# Patient Record
Sex: Female | Born: 1975 | Race: White | Hispanic: No | Marital: Married | Smoking: Former smoker
Health system: Southern US, Community
[De-identification: ages and names within clinical notes are randomized; demographics above are authoritative.]

## PROBLEM LIST (undated history)

## (undated) DIAGNOSIS — G43909 Migraine, unspecified, not intractable, without status migrainosus: Secondary | ICD-10-CM

## (undated) DIAGNOSIS — T7840XA Allergy, unspecified, initial encounter: Secondary | ICD-10-CM

## (undated) DIAGNOSIS — R002 Palpitations: Secondary | ICD-10-CM

## (undated) HISTORY — PX: WISDOM TOOTH EXTRACTION: SHX21

## (undated) HISTORY — DX: Allergy, unspecified, initial encounter: T78.40XA

## (undated) HISTORY — DX: Palpitations: R00.2

## (undated) HISTORY — DX: Migraine, unspecified, not intractable, without status migrainosus: G43.909

---

## 1998-10-18 ENCOUNTER — Other Ambulatory Visit: Admission: RE | Admit: 1998-10-18 | Discharge: 1998-10-18 | Payer: Self-pay | Admitting: Obstetrics and Gynecology

## 1999-09-09 ENCOUNTER — Other Ambulatory Visit: Admission: RE | Admit: 1999-09-09 | Discharge: 1999-09-09 | Payer: Self-pay | Admitting: Obstetrics and Gynecology

## 2000-03-30 ENCOUNTER — Inpatient Hospital Stay (HOSPITAL_COMMUNITY): Admission: AD | Admit: 2000-03-30 | Discharge: 2000-04-02 | Payer: Self-pay | Admitting: Obstetrics and Gynecology

## 2000-05-04 ENCOUNTER — Other Ambulatory Visit: Admission: RE | Admit: 2000-05-04 | Discharge: 2000-05-04 | Payer: Self-pay | Admitting: Obstetrics and Gynecology

## 2000-11-27 ENCOUNTER — Emergency Department (HOSPITAL_COMMUNITY): Admission: EM | Admit: 2000-11-27 | Discharge: 2000-11-28 | Payer: Self-pay

## 2000-12-07 ENCOUNTER — Observation Stay (HOSPITAL_COMMUNITY): Admission: RE | Admit: 2000-12-07 | Discharge: 2000-12-08 | Payer: Self-pay | Admitting: Specialist

## 2001-03-11 ENCOUNTER — Encounter: Payer: Self-pay | Admitting: Specialist

## 2001-03-11 ENCOUNTER — Ambulatory Visit (HOSPITAL_COMMUNITY): Admission: RE | Admit: 2001-03-11 | Discharge: 2001-03-11 | Payer: Self-pay | Admitting: Specialist

## 2001-05-27 ENCOUNTER — Other Ambulatory Visit: Admission: RE | Admit: 2001-05-27 | Discharge: 2001-05-27 | Payer: Self-pay | Admitting: Obstetrics and Gynecology

## 2001-08-27 ENCOUNTER — Encounter: Admission: RE | Admit: 2001-08-27 | Discharge: 2001-09-20 | Payer: Self-pay | Admitting: Orthopaedic Surgery

## 2002-03-03 ENCOUNTER — Other Ambulatory Visit: Admission: RE | Admit: 2002-03-03 | Discharge: 2002-03-03 | Payer: Self-pay | Admitting: Obstetrics and Gynecology

## 2002-09-24 ENCOUNTER — Encounter (INDEPENDENT_AMBULATORY_CARE_PROVIDER_SITE_OTHER): Payer: Self-pay | Admitting: Specialist

## 2002-09-24 ENCOUNTER — Inpatient Hospital Stay (HOSPITAL_COMMUNITY): Admission: AD | Admit: 2002-09-24 | Discharge: 2002-09-26 | Payer: Self-pay | Admitting: Obstetrics and Gynecology

## 2002-10-24 ENCOUNTER — Other Ambulatory Visit: Admission: RE | Admit: 2002-10-24 | Discharge: 2002-10-24 | Payer: Self-pay | Admitting: Obstetrics and Gynecology

## 2003-10-25 ENCOUNTER — Other Ambulatory Visit: Admission: RE | Admit: 2003-10-25 | Discharge: 2003-10-25 | Payer: Self-pay | Admitting: Obstetrics and Gynecology

## 2004-10-16 ENCOUNTER — Ambulatory Visit: Payer: Self-pay | Admitting: Family Medicine

## 2004-11-02 ENCOUNTER — Emergency Department (HOSPITAL_COMMUNITY): Admission: EM | Admit: 2004-11-02 | Discharge: 2004-11-02 | Payer: Self-pay | Admitting: Emergency Medicine

## 2005-01-23 ENCOUNTER — Inpatient Hospital Stay (HOSPITAL_COMMUNITY): Admission: AD | Admit: 2005-01-23 | Discharge: 2005-01-23 | Payer: Self-pay | Admitting: *Deleted

## 2005-05-30 ENCOUNTER — Ambulatory Visit (HOSPITAL_COMMUNITY): Admission: RE | Admit: 2005-05-30 | Discharge: 2005-05-30 | Payer: Self-pay | Admitting: Obstetrics and Gynecology

## 2005-11-12 ENCOUNTER — Inpatient Hospital Stay (HOSPITAL_COMMUNITY): Admission: AD | Admit: 2005-11-12 | Discharge: 2005-11-15 | Payer: Self-pay | Admitting: Obstetrics and Gynecology

## 2005-11-24 ENCOUNTER — Inpatient Hospital Stay (HOSPITAL_COMMUNITY): Admission: AD | Admit: 2005-11-24 | Discharge: 2005-11-24 | Payer: Self-pay | Admitting: Obstetrics and Gynecology

## 2007-07-06 ENCOUNTER — Ambulatory Visit: Payer: Self-pay | Admitting: Family Medicine

## 2007-07-06 DIAGNOSIS — K5289 Other specified noninfective gastroenteritis and colitis: Secondary | ICD-10-CM | POA: Insufficient documentation

## 2007-07-06 DIAGNOSIS — G43909 Migraine, unspecified, not intractable, without status migrainosus: Secondary | ICD-10-CM | POA: Insufficient documentation

## 2007-08-16 ENCOUNTER — Telehealth (INDEPENDENT_AMBULATORY_CARE_PROVIDER_SITE_OTHER): Payer: Self-pay | Admitting: Internal Medicine

## 2008-01-03 ENCOUNTER — Ambulatory Visit: Payer: Self-pay | Admitting: Family Medicine

## 2008-01-03 DIAGNOSIS — L259 Unspecified contact dermatitis, unspecified cause: Secondary | ICD-10-CM | POA: Insufficient documentation

## 2008-01-04 ENCOUNTER — Ambulatory Visit: Payer: Self-pay | Admitting: Family Medicine

## 2008-01-04 DIAGNOSIS — T6391XA Toxic effect of contact with unspecified venomous animal, accidental (unintentional), initial encounter: Secondary | ICD-10-CM | POA: Insufficient documentation

## 2008-09-07 ENCOUNTER — Telehealth: Payer: Self-pay | Admitting: Family Medicine

## 2010-01-09 ENCOUNTER — Encounter (INDEPENDENT_AMBULATORY_CARE_PROVIDER_SITE_OTHER): Payer: Self-pay | Admitting: *Deleted

## 2010-06-20 ENCOUNTER — Telehealth: Payer: Self-pay | Admitting: Family Medicine

## 2010-07-09 NOTE — Letter (Signed)
Summary: Jamie [name] letter  Agency at [facility]  [address] [place], [place] [address]   Phone: [phone]  Fax: [phone]       01/09/2010 MRN: [id number]  Jamie [name] [facility] [address] [place], [place]  [address]  Dear Ms. [facility] [place] [place] announce the retirement of Arta Silence, M.D., from full-time practice at the [facility] office effective December 06, 2009 and his plans of returning part-time.  It is important to Dr. Hetty Ely and to our practice that you understand that [facility] has seven physicians in our office for your health care needs.  We will continue to offer the same exceptional care that you have today.    Dr. Hetty Ely has spoken to many of you about his plans for retirement and returning part-time in the fall.   We will continue to work with you through the transition to schedule appointments for you in the office and meet the high standards that [place] is committed to.   Again, it is with great pleasure that we share the news that Dr. Hetty Ely will return to [facility] in October of 2011 with a reduced schedule.    If you have any questions, or would like to request an appointment with one of our physicians, please call us at [phone] and press the option for Scheduling an appointment.  We take pleasure in providing you with excellent patient care and look forward to seeing you at your next office visit.  Our [facility] Physicians are:  Tillman Abide, M.D. Laurita Quint, M.D. Roxy Manns, M.D. Kerby Nora, M.D. Hannah Beat, M.D. Ruthe Mannan, M.D. We proudly welcomed Raechel Ache, M.D. and Eustaquio Boyden, M.D. to the practice in July/August 2011.  Sincerely,  [facility]

## 2010-07-11 NOTE — Progress Notes (Signed)
Summary: Rx Maxalt  Phone Note Refill Request Call back at [phone] Message from:  [facility] on June 20, 2010 8:41 AM  Refills Requested: Medication #1:  MAXALT-MLT 10 MG  TBDP 1 at onset and repeat in 2 h as needed migraine   Last Refilled: 06/11/2009 HAS NOT BEEN SEEN SINCE JULY 2009  Initial call taken by: Sydell Axon LPN,  June 20, 2010 8:41 AM  Follow-up for Phone Call        Please have pt come in to be seen. Follow-up by: Shaune Leeks MD,  June 20, 2010 10:37 AM  Additional Follow-up for Phone Call Additional follow up Details #1::        Called and spoke to patient to schedule an appointment. Patient stated that she will have to check her schedule and will call back to schedule an appointment. Additional Follow-up by: Sydell Axon LPN,  June 20, 2010 10:42 AM

## 2010-10-25 NOTE — Discharge Summary (Signed)
[facility]  Patient:    Jamie [name], Jamie [name]                         MRN: [id number] Adm. Date:  03/30/00 Disc. Date: 04/02/00 Attending:  Lenoard Aden                           Discharge Summary  HOSPITAL COURSE:              Patient underwent induction for presumed LGA, favorable cervix, at 39+ weeks.  Labor complicated by hypotonicity, had a deep transverse arrest and was fully dilated.  Underwent primary C-section for deep transverse arrest and failure to descend.  Postoperative course uncomplicated. Discharged to home on day #3.  DISCHARGE MEDICATIONS:        Tylox, prenatal vitamins and iron given.  DISCHARGE INSTRUCTIONS:       Discharge teaching done.  FOLLOWUP:                     Follow up in four to six weeks. DD:  04/22/00 TD:  04/23/00 Job: [id number]

## 2010-10-25 NOTE — Op Note (Signed)
NAMESAFIYYAH, [name]                ACCOUNT NO.:  [id number]   MEDICAL RECORD NO.:  [id number]          PATIENT TYPE:  INP   LOCATION:  9132                          [facility]ACILITY:  [facility]   PHYSICIAN:  Lenoard Aden, M.D.DATE OF BIRTH:  [name], 1977   DATE OF PROCEDURE:  11/12/2005  DATE OF DISCHARGE:                                 OPERATIVE REPORT   PREOPERATIVE DIAGNOSIS:  Intrauterine pregnancy at 37 weeks, previous lower  uterine segment partial dehiscence, for repeat cesarean section.   POSTOPERATIVE DIAGNOSIS:  Intrauterine pregnancy at 37 weeks, previous lower  uterine segment partial dehiscence, for repeat cesarean section.   PROCEDURE:  Repeat low segment transverse cesarean section.   SURGEON:  Lenoard Aden, M.D.   ASSISTANT:  Marlinda Mike, C.N.M.   ANESTHESIA:  Spinal by Jean Rosenthal.   ESTIMATED BLOOD LOSS:  1000 cc.   COMPLICATIONS:  None.   DRAINS:  Foley.   DISPOSITION:  Patient to recovery in good condition.   FINDINGS:  Full-term living female, occiput transverse position, Apgars 8  and 9, posterior placenta.  Anterior lower uterine segment well developed,  but no recurrent window noted.  Normal tubes, normal ovaries, no evidence of  lower uterine segment dehiscence.   BRIEF OPERATIVE NOTE:  After being apprised of the risks of anesthesia,  infection, bleeding, or need for repair, immediate complications, bowel and  bladder injury, the patient was brought to the operating room where she was  administered spinal anesthetic without complications, prepped and draped in  usual sterile fashion, and Foley catheter placed.  After achieving adequate  anesthesia, dilute Marcaine solution was placed in the area of previous  Pfannenstiel skin incision.  Incision was then made with the scalpel,  carried down to the fascia which was nicked in the midline and extended  transversely using Mayo scissors.  Rectus muscles were dissected sharply in  the midline.  Peritoneum  entered sharply and bladder blade placed.  Visceral  peritoneum scored in a smile-like fashion and dissected sharply off the  lower uterine segment.  Hysterotomy incision made, atraumatic delivery of a  full-term living female, as noted.  Apgars 8 and 9.  Cord blood collected.  Placenta delivered manually, intact, three vessel cord.  Uterus exteriorized  and closed in 2 layers using 0 Monocryl continuous running imbricating  suture.  Good  hemostasis noted.  Bladder flap inspected, found to be hemostatic.  Irrigation accomplished.  Fascia closed using 0 Monocryl in interrupted  fashion.  Skin was closed using staples.  The patient tolerated the  procedure well and was transferred to recovery in good condition.      Lenoard Aden, M.D.  Electronically Signed     RJT/MEDQ  D:  11/12/2005  T:  11/12/2005  Job:  [id number]

## 2010-10-25 NOTE — Op Note (Signed)
[facility]  Patient:    TAHLOR, BERENGUER Visit Number: [id number] MRN: [id number]          Service Type: DSU Location: DAY Attending Physician:  Rocky Crafts Dictated by:   Michael Litter. Montez Morita, M.D. Proc. Date: 03/11/01 Admit Date:  03/11/2001                             Operative Report  PREOPERATIVE DIAGNOSIS:  Healed distal clavicle fracture with retained coracoclavicular wire.  POSTOPERATIVE DIAGNOSIS:  Healed distal clavicle fracture with retained coracoclavicular wire.  OPERATIVE PROCEDURE:  Wire removal.  SURGEON:  Philips J. Montez Morita, M.D.  DESCRIPTION OF PROCEDURE:  After suitable general anesthesia without intubation, the area over the previous incisional scar was prepped and draped routinely.  An incision was made about 1 inch long over the palpable wire, and the wire was freed up, and clipped on one end, and pulled out.  The wound was closed with some coated Vicryl and subcuticular Monocryl with Steri-Strips dressing.  She goes to the recovery room in good condition.  Half percent Marcaine was placed about the clavicle and in the skin at the end of the procedure. Dictated by:   [employer] Montez Morita, M.D. Attending Physician:  Rocky Crafts DD:  03/11/01 TD:  03/11/01 Job: [id number]

## 2010-10-25 NOTE — Discharge Summary (Signed)
[name], Jamie [name] NO.:  [id number]   MEDICAL RECORD NO.:  [id number]          PATIENT TYPE:  INP   LOCATION:                                [facility]ILITY[facility]  [facility]   PHYSICIAN:  Lenoard Aden, M.D.DATE OF BIRTH:  Apr 14, 1976   DATE OF ADMISSION:  11/12/2005  DATE OF DISCHARGE:  11/15/2005                                 DISCHARGE SUMMARY   HOSPITAL COURSE:  The patient underwent an uncomplicated repeat C-section,  November 12, 2005, postoperative course complicated by minor elevation of blood  pressure, discharged to home day #3.   DISCHARGE MEDICATIONS:  Tylox, Motrin and prenatal vitamins given.   DISCHARGE INSTRUCTIONS:  Discharge teaching done.  Preeclamptic precautions  given.   FOLLOWUP:  Follow up in the office in 1 week.      Lenoard Aden, M.D.  Electronically Signed     RJT/MEDQ  D:  12/30/2005  T:  12/31/2005  Job:  [id number]

## 2010-10-25 NOTE — Op Note (Signed)
NAME:  Jamie [name], Jamie [name]                          ACCOUNT NO.:  [id number]   MEDICAL RECORD NO.:  [id number]                   PATIENT TYPE:  INP   LOCATION:  9101                                 [facility]ILITY[facility]   PHYSICIAN:  Lenoard Aden, M.D.             DATE OF BIRTH:  03-04-1976   DATE OF PROCEDURE:  09/24/2002  DATE OF DISCHARGE:                                 OPERATIVE REPORT   PREOPERATIVE DIAGNOSIS:  A 39-week intrauterine pregnancy with previous  cesarean section, for repeat.   POSTOPERATIVE DIAGNOSES:  1. A 39-week intrauterine pregnancy with previous cesarean section, for     repeat.  2. Lower uterine segment window.   PROCEDURE:  Repeat low segment cesarean section.   SURGEON:  Lenoard Aden, M.D.   ASSISTANTBasilia Jumbo.   ANESTHESIA:  Spinal.   ESTIMATED BLOOD LOSS:  800 mL.   COMPLICATIONS:  None.   DRAINS:  Foley.   COUNTS:  Correct.   DISPOSITION:  The patient went to recovery in good condition.   FINDINGS:  A full-term living female, occiput anterior position, vacuum-  assisted delivery.  Anterior placenta.  No complications.  Very thin lower  uterine segment window with sharply adherent bladder adhesions midway up the  uterine fundus.  Bilateral normal tubes and ovaries.   DESCRIPTION OF PROCEDURE:  After being apprised of the risks of anesthesia,  infection, bleeding, injury to abdominal organs with need for repair, the  patient was brought to the operating room, where she was administered a  spinal anesthetic without complications, prepped and draped in the usual  sterile fashion, a Foley catheter placed.  After achieving adequate  anesthesia, dilute Marcaine solution placed in the area of the previous  Pfannenstiel skin incision and incision made with a scalpel, carried down to  the fascia, which was nicked in the midline and opened transversely using  Mayo scissors.  The rectus muscles dissected sharply in the midline,  peritoneum  entered sharply, bladder blade placed.  The bladder adhesions are  sharply lysed off the lower uterine segment using Metzenbaum scissors.  Upon  revealing the lower uterine segment in its entirety, a lower uterine segment  window is noted with an extensively thin lower uterine segment.  At this  time a curved hysterotomy incision is made, atraumatic delivery using vacuum  over the floating vertex.  A full-term living female, 7 pounds 11 ounces,  handed to the pediatricians in attendance.  Apgars 8 and 9.  Three-vessel  cord noted.  Placenta delivered manually.  Intact three-vessel cord.  Uterus  exteriorized, curetted using a dry lap pack.  Before closing the lower  uterine segment incision, the bladder is further dissected off the otherwise-  thin lower uterine segment extensively, and two layers of a 0 Monocryl  suture are placed with good closure of this previous incision.  Good  hemostasis is noted.  The uterus is  replaced, irrigation is accomplished.  The fascia then closed using the 0 Monocryl in continuous running fashion,  and the skin is closed using staples.  The patient tolerates the procedure  well and is transferred to recovery in good condition.                                                Lenoard Aden, M.D.    RJT/MEDQ  D:  09/24/2002  T:  09/24/2002  Job:  [id number]

## 2010-10-25 NOTE — Discharge Summary (Signed)
   NAME:  Jamie [name], Jamie [name]                          ACCOUNT NO.:  [id number]   MEDICAL RECORD NO.:  [id number]                   PATIENT TYPE:  INP   LOCATION:  9101                                 [facility]   PHYSICIAN:  Lenoard Aden, M.D.             DATE OF BIRTH:  10-21-1975   DATE OF ADMISSION:  09/24/2002  DATE OF DISCHARGE:  09/26/2002                                 DISCHARGE SUMMARY   SUMMARY:  The patient underwent uncomplicated repeat C section on Jenifer 17,  2004.  Postoperative course was uncomplicated.  Discharged to home  postoperative day #3.  Tylox, prenatal vitamins, iron given.  Follow up in  the office in four six weeks.                                               Lenoard Aden, M.D.    RJT/MEDQ  D:  11/23/2002  T:  11/23/2002  Job:  [id number]

## 2010-10-25 NOTE — Op Note (Signed)
[facility]  Patient:    TERRIE, HARING                         MRN: [id number] Proc. Date: 03/31/00 Adm. Date:  [id number] Attending:  Lenoard Aden                           Operative Report  PREOPERATIVE DIAGNOSES:         1. Term intrauterine pregnancy.                                 2. Deep transverse arrest with failure to                                    descend.                                 3. Maternal temperature.  POSTOPERATIVE DIAGNOSES:        1. Term intrauterine pregnancy.                                 2. Deep transverse arrest with failure to                                    descend.                                 3. Maternal temperature.  OPERATION:                      Primary low transverse cesarean section.  SURGEON:                        Lenoard Aden, M.D.  ASSISTANT:                      Genia Del, M.D.  ANESTHESIA:                     Epidural.  ANESTHESIOLOGIST:               Belva Agee, M.D.  ESTIMATED BLOOD LOSS:           1000 cc.  COMPLICATIONS:                  None.  DRAINS:                         Foley.  COUNTS:                         Correct.  DISPOSITION:;                   Patient to recovery in good condition.  FINDINGS:                       Full term living female, left occiput transverse position.  Apgars 8 and 9.  Placenta manually intact.  Three vessel cord noted.  Pediatricians in attendance.  Normal tubes and ovaries.  INFORMED CONSENT:               The patient had been apprised of risks of anesthesia, infection, bleeding, injury to bladder or bowel and need for repair.  DESCRIPTION OF PROCEDURE:       The patient was brought to the operating room where she was prepped and draped in the usual sterile fashion.  After achieving adequate epidural anesthesia and Foley catheter placed, she had a Pfannenstiel skin incision made with the scalpel which was carried down  to the fascia and nicked in the midline and opened transversely using Mayo scissors. Rectus muscles were dissected sharply in the midline. Peritoneum was entered sharply.  Bladder blade placed.  Bladder flap developed in a smile like fashion off of the lower uterine segment.  Uterus was scored in a smile-like fashion.  Clear fluid noted.  Delivery of a full-term living female, left occiput transverse position, handed to pediatricians in attendance.  Apgars 8 and 9 noted.  Uterus exteriorized and placenta removed manually intact from the posterior location.  Normal tubes and ovaries were noted.  Uterus was closed with 0 Monocryl in a continuous running fashion.  Second imbricating layer placed. Bladder flap inspected and found to be hemostatic.  Pericolic gutters irrigated.  All products were subsequently removed.  The fascia was closed using 0 Vicryl in a continuous running fashion.  Skin was closed using staples.  Dilute Marcaine solution placed.  The patient tolerated this well and was transferred to the recovery room in good condition. DD:  03/31/00 TD:  03/31/00 Job: [id number]

## 2010-10-25 NOTE — H&P (Signed)
   NAME:  Jamie [name], Jamie [name]                          ACCOUNT NO.:  [id number]   MEDICAL RECORD NO.:  [id number]                   PATIENT TYPE:  INP   LOCATION:  9198                                 [facility]   PHYSICIAN:  Lenoard Aden, M.D.             DATE OF BIRTH:  April 18, 1976   DATE OF ADMISSION:  09/24/2002  DATE OF DISCHARGE:                                HISTORY & PHYSICAL   CHIEF COMPLAINT:  Elective repeat cesarean section.   HISTORY OF PRESENT ILLNESS:  The patient is a 35 year old white female, G2,  P1, EDC 09/21/02, at 39+ weeks for elective repeat cesarean section.   ALLERGIES:  1. SULFA.  2. DEMEROL.   MEDICATIONS:  Prenatal vitamins.   PAST MEDICAL HISTORY:  History of uncomplicated primary cesarean section in  10/01.   FAMILY HISTORY:  Chronic hypertension.  Skin cancer, kidney, and bladder  cancer.   REVIEW OF SYMPTOMS:  The patient is with a history of irritable bowel  syndrome.   LABORATORY DATA:  Blood type of O positive.  RPR negative.  Rubella immune.  Hepatitis B surface antigen negative.  HIV nonreactive.   Pregnancy uncomplicated with a normal triple screen, normal ultrasounds.  No  hospitalizations during pregnancy.   PHYSICAL EXAMINATION:  GENERAL:  She is a well-developed, well-nourished  white female in no acute distress.  HEENT:  Normal.  LUNGS:  Clear.  HEART:  Regular rhythm.  ABDOMEN:  Soft, gravid, and nontender.  Estimated fetal weight 7.5 to 8  pounds.  PELVIC:  Cervix is closed, 3 cm, long, vertex, and out of the pelvis.   IMPRESSION:  1. A 39 week intrauterine pregnancy.  2. Previous cesarean section.    PLAN:  Plan to proceed with repeat low segment transverse cesarean section.  Risks of anesthesia, infection, bleeding, injury to abdominal organs, and  need for repair is discussed to include delayed complications to include  bowel and bladder which were noted.  The patient understands and wishes to  proceed.                                             Lenoard Aden, M.D.    RJT/MEDQ  D:  09/24/2002  T:  09/24/2002  Job:  [id number]

## 2010-10-25 NOTE — Op Note (Signed)
[facility]  Patient:    Jamie [name], ILLINGWORTH                       MRN: [id number] Proc. Date: 12/07/00 Adm. Date:  [id number] Attending:  Montez Morita, Philips John                           Operative Report  PREOPERATIVE DIAGNOSES:  Fracture of the distal clavicle left shoulder with separation of the acromiocoracoid ligaments.  POSTOPERATIVE DIAGNOSES:  Fracture of the distal clavicle left shoulder with separation of the acromiocoracoid ligaments.  Tip fracture off the coracoid with intact ligament.  OPERATIVE PROCEDURE:  Open reduction, internal fixation of the distal clavicle fracture using a wire around the coracoid and around the clavicle.  SURGEON:  Philips J. Montez Morita, M.D.  DESCRIPTION OF PROCEDURE:  After suitable general anesthesia, she was positioned in the shoulder frame and prepped and draped routinely with the head up at about a 45 degree angle.  Incision was made over the inferior clavicle, and the fascia and then the muscles were taken off on either side, exposing the fracture site and about 3 inches of the clavicle.  The clavicle was not exposed subperiosteally or dorsally.  A fracture was noted off the coracoid with attached ligament to it.  We then freed up the coracoid medial and lateral with Bovie and blunt dissection and then passed an aneurysm needle around and also passed an 18 Zimaloy wire underneath, across it, and then brought it out the posterior aspect of the clavicle and then around the front, reduced the clavicle and tightened the wire with wire tighteners, and a nice reduction was accomplished.  The wire was cut off and bent down.  The left anterior border of the clavicle ______ retrieval.  Some 0.5% plain Marcaine was placed into the deep tissues, and the deltoid and trapezius muscles were repaired back to each other with interrupted #1 coated Vicryl with 2-0 in the subcutaneous and running 4-0 Monocryl in the skin with  Steri-Strips.  A total of 20 cc of 0.5% Marcaine were used throughout the deep tissues and more superficial.  A nice compression dressing and a sling, and she goes to recovery in good condition. DD:  12/07/00 TD:  12/07/00 Job: [id number]

## 2013-04-26 ENCOUNTER — Emergency Department (INDEPENDENT_AMBULATORY_CARE_PROVIDER_SITE_OTHER): Payer: BC Managed Care – PPO

## 2013-04-26 ENCOUNTER — Emergency Department (HOSPITAL_COMMUNITY)
Admission: EM | Admit: 2013-04-26 | Discharge: 2013-04-26 | Disposition: A | Discharge status: Home / Self Care | Payer: BC Managed Care – PPO | Source: Home / Self Care | Attending: Family Medicine | Admitting: Family Medicine

## 2013-04-26 ENCOUNTER — Encounter (HOSPITAL_COMMUNITY): Payer: Self-pay | Admitting: Emergency Medicine

## 2013-04-26 DIAGNOSIS — R071 Chest pain on breathing: Secondary | ICD-10-CM

## 2013-04-26 DIAGNOSIS — R0789 Other chest pain: Secondary | ICD-10-CM

## 2013-04-26 MED ORDER — DICLOFENAC POTASSIUM 50 MG PO TABS: 50.0000 mg | ORAL_TABLET | Freq: Three times a day (TID) | ORAL | Status: DC

## 2013-04-26 MED ORDER — CYCLOBENZAPRINE HCL 5 MG PO TABS: 5.0000 mg | ORAL_TABLET | Freq: Three times a day (TID) | ORAL | Status: DC | PRN

## 2013-04-26 NOTE — ED Provider Notes (Signed)
CSN: [id number]     Arrival date & time 04/26/13  1050 History   First MD Initiated Contact with Patient 04/26/13 1121     Chief Complaint  Patient presents with  . Shoulder Pain   (Consider location/radiation/quality/duration/timing/severity/associated sxs/prior Treatment) Patient is a 37 y.o. female presenting with shoulder pain. The history is provided by the patient.  Shoulder Pain This is a new problem. The current episode started 2 days ago. The problem has not changed since onset.Associated symptoms include chest pain. Pertinent negatives include no abdominal pain and no shortness of breath.    History reviewed. No pertinent past medical history. History reviewed. No pertinent past surgical history. No family history on file. History  Substance Use Topics  . Smoking status: Never Smoker   . Smokeless tobacco: Not on file  . Alcohol Use: No   OB History   Grav Para Term Preterm Abortions TAB SAB Ect Mult Living                 Review of Systems  Constitutional: Negative.  Negative for fever and chills.  Respiratory: Negative for cough and shortness of breath.   Cardiovascular: Positive for chest pain. Negative for palpitations and leg swelling.  Gastrointestinal: Negative for abdominal pain.    Allergies  Meperidine hcl and Sulfonamide derivatives  Home Medications   Current Outpatient Rx  Name  Route  Sig  Dispense  Refill  . cetirizine (ZYRTEC) 10 MG tablet   Oral   Take 10 mg by mouth as needed for allergies.         . cyclobenzaprine (FLEXERIL) 5 MG tablet   Oral   Take 1 tablet (5 mg total) by mouth 3 (three) times daily as needed for muscle spasms.   30 tablet   0   . diclofenac (CATAFLAM) 50 MG tablet   Oral   Take 1 tablet (50 mg total) by mouth 3 (three) times daily.   30 tablet   0   . rizatriptan (MAXALT) 10 MG tablet   Oral   Take 10 mg by mouth as needed for migraine. May repeat in 2 hours if needed          BP 134/85  Pulse 79   Temp(Src) 98.9 F (37.2 C) (Oral)  Resp 18  SpO2 99%  LMP 03/28/2013 Physical Exam  Nursing note and vitals reviewed. Constitutional: She is oriented to person, place, and time. She appears well-developed and well-nourished. No distress.  Neck: Normal range of motion. Neck supple.  Cardiovascular: Normal rate, regular rhythm, normal heart sounds and intact distal pulses.   Pulmonary/Chest: Effort normal and breath sounds normal. She exhibits no tenderness.  Abdominal: Soft. Bowel sounds are normal.  Musculoskeletal: She exhibits no edema.  Lymphadenopathy:    She has no cervical adenopathy.  Neurological: She is alert and oriented to person, place, and time.  Skin: Skin is warm and dry.    ED Course  Procedures (including critical care time) Labs Review Labs Reviewed - No data to display Imaging Review No results found.  EKG Interpretation    Date/Time:    Ventricular Rate:    PR Interval:    QRS Duration:   QT Interval:    QTC Calculation:   R Axis:     Text Interpretation:              MDM      Linna Hoff, MD 05/11/13 1956

## 2013-04-26 NOTE — ED Notes (Signed)
Pt c/o left shoulder pain and back pain onset Sunday night Denies: inj/trauma, strenuous activity, urinary sxs, abd pain, f/v/n/d Reports the pain is intermittent  Taking ibup w/no relief Alert w/no signs of acute distress... Steady gait w/NAD

## 2013-05-02 ENCOUNTER — Telehealth: Payer: Self-pay | Admitting: Family Medicine

## 2013-05-02 NOTE — Telephone Encounter (Signed)
Appt scheduled on 11/25 with Dr. Ermalene Searing.

## 2013-05-02 NOTE — Telephone Encounter (Signed)
Patient Information:  Caller Name: [name]  Phone: [phone]  Patient: Jamie [name], Jamie [name]  Gender: Female  DOB: [birth year]  Age: 37 Years  PCP: Ruthe Mannan [facility])  Pregnant: No  Office Follow Up:  Does the office need to follow up with this patient?: Yes  Instructions For The Office: No appts. available at [facility], [place] , [place] or [employer]. Disposition obtained of  " See in office today." Please return call to patient at [phone] regarding possible work in appt.  RN Note:  Patient denies possibility of pregnancy. Patient states she developed left shoulder and mid back pain, onset 04/24/13. Patient states she was seen at [facility] 04/26/13. Patient states she had a negative chest xray and was advised that the pain was muscular. Patient states she has been taking Ibupropfen, using ice and heat without improvement. Patient states she was seen by a Chiropractor 04/28/13. Patient states she continues to have left shoulder and mid back pain. States pain does not increase with movement or deep breathing. Patient states she has numbness and tingling in the fingers of left hand. Onset 04/30/13. No bluish color noted. no weakness of extremity. No known injury. No rash or lesions. States pain is intermittent. Denies chest pain or difficulty breathing. Denies flank pain. Care advice given per guidelines. Call back parameters reviewed. Patient verbalizes understanding.  Symptoms  Reason For Call & Symptoms: Left Back and shoulder pain  Reviewed Health History In EMR: Yes  Reviewed Medications In EMR: Yes  Reviewed Allergies In EMR: Yes  Reviewed Surgeries / Procedures: Yes  Date of Onset of Symptoms: 04/24/2013  Treatments Tried: Ibuprofen, ice, heat  Treatments Tried Worked: No OB / GYN:  LMP: 03/28/2013  Guideline(s) Used:  Back Pain  Shoulder Pain  Disposition Per Guideline:   See Today in Office  Reason For Disposition Reached:   Numbness (i.e.,  loss of sensation) in hand or fingers  Advice Given:  Reassurance:  Twisting or heavy lifting can cause back pain.  Cold or Heat:  Cold Pack: For pain or swelling, use a cold pack or ice wrapped in a wet cloth. Put it on the sore area for 20 minutes. Repeat 4 times on the first day, then as needed.  Heat Pack: If pain lasts over 2 days, apply heat to the sore area. Use a heat pack, heating pad, or warm wet washcloth. Do this for 10 minutes, then as needed. For widespread stiffness, take a hot bath or hot shower instead. Move the sore area under the warm water.  Sleep:  Sleep on your side with a pillow between your knees. If you sleep on your back, put a pillow under your knees.  Avoid sleeping on your stomach.  Your mattress should be firm. Avoid waterbeds.  Pain Medicines:  For pain relief, take acetaminophen, ibuprofen, or naproxen.  Call Back If:  Numbness or weakness occur  You become worse.  Pain Medicines:  For pain relief, you can take either acetaminophen, ibuprofen, or naproxen.  Call Back If  You become worse.  Patient Will Follow Care Advice:  YES

## 2013-05-02 NOTE — Telephone Encounter (Signed)
Ok to be seen tommorow by any provider

## 2013-05-03 ENCOUNTER — Ambulatory Visit (INDEPENDENT_AMBULATORY_CARE_PROVIDER_SITE_OTHER): Payer: BC Managed Care – PPO | Admitting: Family Medicine

## 2013-05-03 ENCOUNTER — Encounter: Payer: Self-pay | Admitting: Family Medicine

## 2013-05-03 VITALS — BP 140/100 | HR 93 | Temp 98.1°F | Wt 158.8 lb

## 2013-05-03 DIAGNOSIS — R200 Anesthesia of skin: Secondary | ICD-10-CM

## 2013-05-03 DIAGNOSIS — R202 Paresthesia of skin: Secondary | ICD-10-CM

## 2013-05-03 DIAGNOSIS — R002 Palpitations: Secondary | ICD-10-CM | POA: Insufficient documentation

## 2013-05-03 DIAGNOSIS — M79609 Pain in unspecified limb: Secondary | ICD-10-CM

## 2013-05-03 DIAGNOSIS — R071 Chest pain on breathing: Secondary | ICD-10-CM

## 2013-05-03 DIAGNOSIS — R0789 Other chest pain: Secondary | ICD-10-CM

## 2013-05-03 DIAGNOSIS — M79602 Pain in left arm: Secondary | ICD-10-CM

## 2013-05-03 DIAGNOSIS — R209 Unspecified disturbances of skin sensation: Secondary | ICD-10-CM

## 2013-05-03 DIAGNOSIS — M549 Dorsalgia, unspecified: Secondary | ICD-10-CM

## 2013-05-03 LAB — COMPREHENSIVE METABOLIC PANEL
ALT: 16 U/L (ref 0–35)
Albumin: 4.2 g/dL (ref 3.5–5.2)
Alkaline Phosphatase: 53 U/L (ref 39–117)
BUN: 11 mg/dL (ref 6–23)
GFR: 106.74 mL/min (ref >60.00)
Glucose, Bld: 84 mg/dL (ref 70–99)
Sodium: 136 mEq/L (ref 135–145)
Total Protein: 7.6 g/dL (ref 6.0–8.3)

## 2013-05-03 LAB — CBC WITH DIFFERENTIAL/PLATELET
Basophils Relative: 0.5 % (ref 0.0–3.0)
Eosinophils Absolute: 0.1 10*3/uL (ref 0.0–0.7)
Eosinophils Relative: 2.1 % (ref 0.0–5.0)
MCHC: 34.2 g/dL (ref 30.0–36.0)
MCV: 91.8 fl (ref 78.0–100.0)
Monocytes Absolute: 0.4 10*3/uL (ref 0.1–1.0)
Neutrophils Relative %: 60.4 % (ref 43.0–77.0)
Platelets: 221 10*3/uL (ref 150.0–400.0)
RDW: 12.6 % (ref 11.5–14.6)
WBC: 5.1 10*3/uL (ref 4.5–10.5)

## 2013-05-03 LAB — POCT URINALYSIS DIPSTICK
Glucose, UA: NEGATIVE
Nitrite, UA: NEGATIVE

## 2013-05-03 LAB — TSH: TSH: 1.36 u[IU]/mL (ref 0.35–5.50)

## 2013-05-03 NOTE — Patient Instructions (Signed)
Stop at lab on way out, we will call with results. Follow up in 1 week with Dr. Ermalene Searing if not improved.

## 2013-05-03 NOTE — Progress Notes (Signed)
Subjective:    Patient ID: Jamie [name], female    DOB: 1976-02-24, 37 y.o.   MRN: [id number]  HPI  37 year old female pt of Dr. Elmer Sow presents with  new onset 1 week ago central back pain and left lateral shoulder. 6/10 on pain scale.   Woke her up at night. Constant pain thorough  the night Improved slightly with ibuprofen.   Had noted lately she was mildly short of breath. She went to urgent care 11/18... Had nml CXR.  Dx with muscle spasm.. Given antiinflammatories and muscle relaxant that she did not feel.  Continued to have constant mid back pain and left shoulder pain.  3 days ago pain in left arm and finger tingly.   Occ intermittant pain in left anterior chest wall. Yesterday occ fingers cold and burning.  She has been feeling off lately. Occ has woken up in last few days with heart racing.    Hx of collar bone fracture and repair few years ago... She has chronic ache at times.  HTN in pregnancy hx... Was able to wean off BP meds.    Review of Systems  Constitutional: Positive for fatigue. Negative for fever and unexpected weight change.  Respiratory: Positive for shortness of breath. Negative for cough and wheezing.   Cardiovascular: Positive for chest pain and palpitations. Negative for leg swelling.  Gastrointestinal: Positive for nausea. Negative for abdominal pain, diarrhea and constipation.  Genitourinary: Negative for dysuria and hematuria.  Musculoskeletal: Positive for back pain. Negative for joint swelling.  Neurological: Negative for tremors and weakness.  Psychiatric/Behavioral: Negative for dysphoric mood and agitation. The patient is nervous/anxious.        She has been anxious since she has had these symptoms.       Objective:   Physical Exam  Constitutional: She is oriented to person, place, and time. Vital signs are normal. She appears well-developed and well-nourished. She is cooperative.  Non-toxic appearance. She does not appear ill. No  distress.  Pt became tearful discussing symptoms and concern about symptoms  HENT:  Head: Normocephalic.  Right Ear: Hearing, tympanic membrane, external ear and ear canal normal. Tympanic membrane is not erythematous, not retracted and not bulging.  Left Ear: Hearing, tympanic membrane, external ear and ear canal normal. Tympanic membrane is not erythematous, not retracted and not bulging.  Nose: No mucosal edema or rhinorrhea. Right sinus exhibits no maxillary sinus tenderness and no frontal sinus tenderness. Left sinus exhibits no maxillary sinus tenderness and no frontal sinus tenderness.  Mouth/Throat: Uvula is midline, oropharynx is clear and moist and mucous membranes are normal.  Eyes: Conjunctivae, EOM and lids are normal. Pupils are equal, round, and reactive to light. Lids are everted and swept, no foreign bodies found.  Neck: Trachea normal and normal range of motion. Neck supple. Carotid bruit is not present. No mass and no thyromegaly present.  Cardiovascular: Normal rate, regular rhythm, S1 normal, S2 normal, normal heart sounds, intact distal pulses and normal pulses.  Exam reveals no gallop and no friction rub.   No murmur heard. Pulmonary/Chest: Effort normal and breath sounds normal. Not tachypneic. No respiratory distress. She has no decreased breath sounds. She has no wheezes. She has no rhonchi. She has no rales.  Abdominal: Soft. Normal appearance and bowel sounds are normal. There is no tenderness.  Musculoskeletal:       Right shoulder: She exhibits normal range of motion, no tenderness and no bony tenderness.  Left shoulder: She exhibits normal range of motion, no tenderness, no bony tenderness, no swelling and no deformity.       Left wrist: She exhibits normal range of motion and no tenderness.       Cervical back: She exhibits normal range of motion, no tenderness, no bony tenderness and no swelling.       Right forearm: She exhibits no tenderness, no bony  tenderness and no swelling.       Right hand: She exhibits normal range of motion.  Points to pain over left shoulder blade but not ttp, neg spurlings, no neck ttp, full ROM of left shoulder.   neg tinel and phalen's  Neurological: She is alert and oriented to person, place, and time.  Skin: Skin is warm, dry and intact. No rash noted.  Psychiatric: Her speech is normal and behavior is normal. Judgment and thought content normal. Her mood appears not anxious. Cognition and memory are normal. She does not exhibit a depressed mood.          Assessment & Plan:  Pt with multiple symptoms that are hard to connect together in one etiology. EKG: NSR  Will eval with labs for anemia, thyroid issues, electrolyte  and B12 issues given tingling.  No clear sign of neck or shoulder pathology.  ? Symptoms secondary to anxiety/ stress... Pt stae she is mainly upset given symptoms and last MD did not listen to her.  Total visit time 30 minutes, > 50% spent counseling and cordinating patients care.  BP is likely elevated due to pain/concern... Follow at home.  May need med if  elevated at follow up.

## 2013-05-03 NOTE — Addendum Note (Signed)
Addended by: Liane Comber C on: 05/03/2013 12:02 PM   Modules accepted: Orders

## 2013-07-18 ENCOUNTER — Ambulatory Visit (INDEPENDENT_AMBULATORY_CARE_PROVIDER_SITE_OTHER): Payer: BC Managed Care – PPO | Admitting: Family Medicine

## 2013-07-18 ENCOUNTER — Encounter: Payer: Self-pay | Admitting: Family Medicine

## 2013-07-18 VITALS — BP 126/82 | HR 76 | Temp 98.1°F | Ht 64.5 in | Wt 166.0 lb

## 2013-07-18 DIAGNOSIS — G43909 Migraine, unspecified, not intractable, without status migrainosus: Secondary | ICD-10-CM

## 2013-07-18 MED ORDER — NORETHIN-ETH ESTRAD-FE BIPHAS 1 MG-10 MCG / 10 MCG PO TABS: 1.0000 | ORAL_TABLET | Freq: Every day | ORAL | Status: DC

## 2013-07-18 NOTE — Patient Instructions (Signed)
Nice to meet you.  We are starting Lo loestrin.  Please call me in a few months with an update.

## 2013-07-18 NOTE — Assessment & Plan Note (Signed)
Deteriorated. Discussed tx options.  Unclear if prophylaxis will help given that her symptoms are mainly associated with her menstrual cycle.  She is a non smoker and active, so she is low risk for OCPs.   Will try OCPs for 2-3 months to see if this lessens frequency of migraines.  If it does, ok to d/c. The patient indicates understanding of these issues and agrees with the plan.

## 2013-07-18 NOTE — Progress Notes (Signed)
Pre-visit discussion using our clinic review tool. No additional management support is needed unless otherwise documented below in the visit note.  

## 2013-07-18 NOTE — Progress Notes (Signed)
   Subjective:   Patient ID: Jamie [name], female    DOB: June 22, 1975, 38 y.o.   MRN: [id number]  Jamie [name] is a pleasant 38 y.o. year old female who presents to clinic today with Establish Care and Migraine  on 07/18/2013  HPI: 38 yo pleasant female here to establish care and to discuss migraines.  Long term migraine suffer.  Has been worse since she had her 1st child. Main trigger seems to be her menstrual cycle.  Gets a migraine every month after her period. Does have severe menstrual cramps and heavy bleeding as well.  Husband has had a vasectomy so has not needed OCPs.  Sees Dr. Ronita Hipps, OBGYN.  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  No nausea or vomiting.  Takes Maxalt for abortive therapy which usually works- had to take 3 maxalt within a week last month.  Patient Active Problem List   Diagnosis Date Noted  . MIGRAINE HEADACHE 07/06/2007   Past Medical History  Diagnosis Date  . Migraines   . Allergy    Past Surgical History  Procedure Laterality Date  . Cesarean section    . Wisdom tooth extraction     History  Substance Use Topics  . Smoking status: Never Smoker   . Smokeless tobacco: Never Used  . Alcohol Use: No   Family History  Problem Relation Age of Onset  . Hypertension Mother   . Cancer Mother   . Hyperlipidemia Father   . Arthritis Father   . Cancer Maternal Grandmother   . Cancer Paternal Grandmother   . Heart disease Paternal Grandfather    Allergies  Allergen Reactions  . Meperidine Hcl     REACTION: increased BP \T\ Pulse  . Sulfonamide Derivatives     REACTION: rash   Current Outpatient Prescriptions on File Prior to Visit  Medication Sig Dispense Refill  . cetirizine (ZYRTEC) 10 MG tablet Take 10 mg by mouth as needed for allergies.      . rizatriptan (MAXALT) 10 MG tablet Take 10 mg by mouth as needed for migraine. May repeat in 2 hours if needed       No current facility-administered medications on file prior to visit.     The PMH, PSH, Social History, Family History, Medications, and allergies have been reviewed in [facility], and have been updated if relevant.   Review of Systems    See HPI No other focal neurological deficits Objective:    BP 126/82  Pulse 76  Temp(Src) 98.1 F (36.7 C) (Oral)  Ht 5' 4.5" (1.638 m)  Wt 166 lb (75.297 kg)  BMI 28.06 kg/m2  SpO2 98%  LMP 07/15/2013   Physical Exam  Nursing note and vitals reviewed. Constitutional: She appears well-developed and well-nourished. No distress.  HENT:  Head: Normocephalic and atraumatic.  Cardiovascular: Normal rate and regular rhythm.   Pulmonary/Chest: Effort normal and breath sounds normal.  Neurological: She is alert. She has normal strength. No cranial nerve deficit or sensory deficit.  Psychiatric: She has a normal mood and affect. Her speech is normal and behavior is normal. Judgment and thought content normal. Cognition and memory are normal.          Assessment & Plan:   MIGRAINE HEADACHE No Follow-up on file.

## 2014-10-23 ENCOUNTER — Ambulatory Visit (INDEPENDENT_AMBULATORY_CARE_PROVIDER_SITE_OTHER): Payer: BLUE CROSS/BLUE SHIELD | Admitting: Family Medicine

## 2014-10-23 ENCOUNTER — Telehealth: Payer: Self-pay | Admitting: Family Medicine

## 2014-10-23 ENCOUNTER — Encounter: Payer: Self-pay | Admitting: Family Medicine

## 2014-10-23 VITALS — BP 120/80 | HR 78 | Temp 98.4°F | Ht 64.5 in | Wt 178.2 lb

## 2014-10-23 DIAGNOSIS — D649 Anemia, unspecified: Secondary | ICD-10-CM | POA: Diagnosis not present

## 2014-10-23 DIAGNOSIS — R002 Palpitations: Secondary | ICD-10-CM

## 2014-10-23 LAB — COMPREHENSIVE METABOLIC PANEL
ALBUMIN: 4 g/dL (ref 3.5–5.2)
ALK PHOS: 64 U/L (ref 39–117)
ALT: 12 U/L (ref 0–35)
AST: 16 U/L (ref 0–37)
BUN: 10 mg/dL (ref 6–23)
CO2: 29 mEq/L (ref 19–32)
Calcium: 9.4 mg/dL (ref 8.4–10.5)
Chloride: 104 mEq/L (ref 96–112)
Creatinine, Ser: 0.68 mg/dL (ref 0.40–1.20)
GFR: 102.33 mL/min (ref >60.00)
Glucose, Bld: 108 mg/dL — ABNORMAL HIGH (ref 70–99)
POTASSIUM: 4.3 meq/L (ref 3.5–5.1)
Sodium: 135 mEq/L (ref 135–145)
Total Bilirubin: 0.4 mg/dL (ref 0.2–1.2)
Total Protein: 7.4 g/dL (ref 6.0–8.3)

## 2014-10-23 LAB — CBC WITH DIFFERENTIAL/PLATELET
BASOS PCT: 0.5 % (ref 0.0–3.0)
Basophils Absolute: 0 10*3/uL (ref 0.0–0.1)
EOS ABS: 0 10*3/uL (ref 0.0–0.7)
Eosinophils Relative: 0.9 % (ref 0.0–5.0)
HCT: 35.3 % — ABNORMAL LOW (ref 36.0–46.0)
HEMOGLOBIN: 12 g/dL (ref 12.0–15.0)
LYMPHS ABS: 1.1 10*3/uL (ref 0.7–4.0)
LYMPHS PCT: 23.1 % (ref 12.0–46.0)
MCHC: 34.1 g/dL (ref 30.0–36.0)
MCV: 90.2 fl (ref 78.0–100.0)
MONO ABS: 0.3 10*3/uL (ref 0.1–1.0)
Monocytes Relative: 5.2 % (ref 3.0–12.0)
NEUTROS ABS: 3.5 10*3/uL (ref 1.4–7.7)
NEUTROS PCT: 70.3 % (ref 43.0–77.0)
Platelets: 242 10*3/uL (ref 150.0–400.0)
RBC: 3.91 Mil/uL (ref 3.87–5.11)
RDW: 13.5 % (ref 11.5–15.5)
WBC: 4.9 10*3/uL (ref 4.0–10.5)

## 2014-10-23 LAB — TSH: TSH: 1.02 u[IU]/mL (ref 0.35–4.50)

## 2014-10-23 LAB — FERRITIN: Ferritin: 22 ng/mL (ref 10.0–291.0)

## 2014-10-23 LAB — IBC PANEL
IRON: 139 ug/dL (ref 42–145)
Saturation Ratios: 38.9 % (ref 20.0–50.0)
Transferrin: 255 mg/dL (ref 212.0–360.0)

## 2014-10-23 NOTE — Assessment & Plan Note (Addendum)
Fleeting and brief along with malaise and migraine this weekend  Disc triggers for this and migraine incl hormonal change/ change in sleep habits / recent MSG/caffeine  Will work on cutting out coffee Watch labels for MSG-suspect rxn to [employer] If no improvement - recommend cardiol eval or monitor  Will call if worse or other card symptoms  Reassuring EKG-few ectopic beats  bp better on 2nd check  Lab today for tsh and chemistries and cbc

## 2014-10-23 NOTE — Telephone Encounter (Signed)
Patient Name: Jamie [name] DOB: Jul 05, 1975 Initial Comment Caller states has been having heart palpitations , hx of Migraines, slept for the past almost 24 hrs , wants to make Nurse Assessment Nurse: Marcelline Deist, RN, Kermit Balo Date/Time (Eastern Time): 10/23/2014 8:21:51 AM Confirm and document reason for call. If symptomatic, describe symptoms. ---Caller states has been having heart palpitations - extra beats , has a history of migraines. Has slept for the past almost 24 hrs. Took Maxalt once yesterday. Has the patient traveled out of the country within the last 30 days? ---Not Applicable Does the patient require triage? ---Yes Related visit to physician within the last 2 weeks? ---No Does the PT have any chronic conditions? (i.e. diabetes, asthma, etc.) ---Yes List chronic conditions. ---migranes Did the patient indicate they were pregnant? ---No Guidelines Guideline Title Affirmed Question Affirmed Notes Heart Rate and Heartbeat Questions [1] Skipped or extra beat(s) AND [2] occurs 4 or more times per minute Final Disposition User See Physician within 4 Hours (or PCP triage) Marcelline Deist, RN, Lynda Comments Caller states during the night/early this am, she did have some upper abdominal discomfort/pressure. Has noticed the heart palpitations are occurring with more frequency since they began on Sat.

## 2014-10-23 NOTE — Patient Instructions (Signed)
The next few days - drink a lot of water to flush out your system  If you can drink less coffee or change to decaf -do so  If palpitations persist - then you do need to stop caffeine and let us know if no improvement  Avoid MSG- I suspect you may have had a reaction   Labs today

## 2014-10-23 NOTE — Progress Notes (Signed)
Pre visit review using our clinic review tool, if applicable. No additional management support is needed unless otherwise documented below in the visit note. 

## 2014-10-23 NOTE — Telephone Encounter (Signed)
Appointment with Dr. Glori Bickers this morning.

## 2014-10-23 NOTE — Assessment & Plan Note (Signed)
Per pt chronic-? Rel to menses  ? If this could add to palpitations Cbc and iron labs today

## 2014-10-23 NOTE — Telephone Encounter (Signed)
Here for appt

## 2014-10-23 NOTE — Progress Notes (Signed)
Subjective:    Patient ID: Jamie [name], female    DOB: 08-21-1975, 39 y.o.   MRN: [id number]  HPI Here for c/o palpitations   Of note-hx of migraines  Sat - noticed brief and fleeting heart palpitations when going to bed-like and extra beat  Sunday - noted several bouts of palpitations - from noon on - brief but repeditive (same sensation)  Gives her the sensation that she needs to cough  This was before she took the Maxalt  Last time she felt it was this am  No cp Cyndi Bender or sob at all    Yesterday got up and got into the shower (more tired than usual) , noted her post R shoulder was achey No fever or chills  Then all over body aches followed thru the day  Got a migraine - and took a maxalt (and went to sleep for the rest of the day) Some nausea/diarrhea -she often gets with migraines  Woke up with epigastric pain today- ? Gas- and that is better now   EKG shows NSR with rate of 84 occ ectopic ventricular beat   Wt is up 12 lb from prev visit bmi of 30  Hx of elevated bp after her daughter was born   BP Readings from Last 3 Encounters:  10/23/14 120/90  07/18/13 126/82  05/03/13 140/100    Drank 1 cup of coffee yesterday am 9  Drinks one cup of coffee per day - no soft drinks and no tea   Not eating a lot - she is nauseated   Headache is there - but not severe - standard for the day after a migraine   Usually gets migraines once per mo - usually after menses (now she is about to start)  Also gets one if sleep deprived (had been up late and slept late)- no etoh   Ate chinese food the night before   Does have hx of anemia-not checked for a while  ? Due to menses Not on iron   Patient Active Problem List   Diagnosis Date Noted  . Anemia 10/23/2014  . Palpitations 10/23/2014  . MIGRAINE HEADACHE 07/06/2007   Past Medical History  Diagnosis Date  . Migraines   . Allergy    Past Surgical History  Procedure Laterality Date  . Cesarean section    .  Wisdom tooth extraction     History  Substance Use Topics  . Smoking status: Never Smoker   . Smokeless tobacco: Never Used  . Alcohol Use: No   Family History  Problem Relation Age of Onset  . Hypertension Mother   . Cancer Mother   . Hyperlipidemia Father   . Arthritis Father   . Cancer Maternal Grandmother   . Cancer Paternal Grandmother   . Heart disease Paternal Grandfather    Allergies  Allergen Reactions  . Meperidine Hcl     REACTION: increased BP \T\ Pulse  . Sulfonamide Derivatives     REACTION: rash   Current Outpatient Prescriptions on File Prior to Visit  Medication Sig Dispense Refill  . cetirizine (ZYRTEC) 10 MG tablet Take 10 mg by mouth as needed for allergies.    . rizatriptan (MAXALT) 10 MG tablet Take 10 mg by mouth as needed for migraine. May repeat in 2 hours if needed     No current facility-administered medications on file prior to visit.    Review of Systems Review of Systems  Constitutional: Negative for fever, appetite change,  and  unexpected weight change. pos for fatigue/malaise improved from yesterday ENT neg for sinus or ear pain or nasal cong Eyes: Negative for pain and visual disturbance.  Respiratory: Negative for cough and shortness of breath.   Cardiovascular: Negative for cp or palpitations    Gastrointestinal: Negative for blood in stool, dark stool or vomiting (pos for abd pain that resolved this am)  Genitourinary: Negative for urgency and frequency.  Skin: Negative for pallor or rash   Neurological: Negative for weakness, light-headedness, numbness and pos for  headaches.  Hematological: Negative for adenopathy. Does not bruise/bleed easily.  Psychiatric/Behavioral: Negative for dysphoric mood. The patient is not nervous/anxious.         Objective:   Physical Exam  Constitutional: She is oriented to person, place, and time. She appears well-developed and well-nourished. No distress.  overwt and well appearing   HENT:  Head:  Normocephalic and atraumatic.  Right Ear: External ear normal.  Left Ear: External ear normal.  Nose: Nose normal.  Mouth/Throat: Oropharynx is clear and moist. No oropharyngeal exudate.  No sinus tenderness No temporal tenderness  No TMJ tenderness  Eyes: Conjunctivae and EOM are normal. Pupils are equal, round, and reactive to light. Right eye exhibits no discharge. Left eye exhibits no discharge. No scleral icterus.  No nystagmus  Neck: Normal range of motion and full passive range of motion without pain. Neck supple. No JVD present. Carotid bruit is not present. No tracheal deviation present. No thyromegaly present.  Cardiovascular: Normal rate, regular rhythm and normal heart sounds.   No murmur heard. Pulmonary/Chest: Effort normal and breath sounds normal. No respiratory distress. She has no wheezes. She has no rales. She exhibits no tenderness.  Abdominal: Soft. Bowel sounds are normal. She exhibits no distension and no mass. There is no hepatosplenomegaly. There is no tenderness. There is no rebound, no guarding and no CVA tenderness.  Musculoskeletal: She exhibits no edema or tenderness.  Lymphadenopathy:    She has no cervical adenopathy.  Neurological: She is alert and oriented to person, place, and time. She has normal strength and normal reflexes. She displays no atrophy and no tremor. No cranial nerve deficit or sensory deficit. She exhibits normal muscle tone. She displays a negative Romberg sign. Coordination and gait normal.  No focal cerebellar signs   Skin: Skin is warm and dry. No rash noted. No pallor.  Psychiatric: She has a normal mood and affect. Her behavior is normal. Thought content normal.          Assessment & Plan:   Problem List Items Addressed This Visit    Anemia    Per pt chronic-? Rel to menses  ? If this could add to palpitations Cbc and iron labs today      Relevant Orders   CBC with Differential/Platelet   Ferritin   IBC Panel    Palpitations - Primary    Fleeting and brief along with malaise and migraine this weekend  Disc triggers for this and migraine incl hormonal change/ change in sleep habits / recent MSG/caffeine  Will work on cutting out coffee Watch labels for MSG-suspect rxn to [employer] If no improvement - recommend cardiol eval or monitor  Will call if worse or other card symptoms  Reassuring EKG-few ectopic beats  bp better on 2nd check  Lab today for tsh and chemistries and cbc      Relevant Orders   EKG 12-Lead (Completed)   CBC with Differential/Platelet   Comprehensive  metabolic panel   TSH

## 2014-10-30 ENCOUNTER — Telehealth: Payer: Self-pay

## 2014-10-30 DIAGNOSIS — R002 Palpitations: Secondary | ICD-10-CM

## 2014-10-30 NOTE — Telephone Encounter (Signed)
Pt notified referral done to cardiology and our [facility] will call to scheduled appt

## 2014-10-30 NOTE — Telephone Encounter (Signed)
I am referring her to cardiology for a visit

## 2014-10-30 NOTE — Telephone Encounter (Signed)
Pt left v/m; pt was seen on 10/23/14; pt has not had caffeine in one week and same heart palpitations are continuing as when pt was seen on 10/23/14 and pt wants to know what to do next. Pt request cb.

## 2014-11-02 ENCOUNTER — Emergency Department (HOSPITAL_COMMUNITY): Payer: BLUE CROSS/BLUE SHIELD

## 2014-11-02 ENCOUNTER — Encounter (HOSPITAL_COMMUNITY): Payer: Self-pay | Admitting: Emergency Medicine

## 2014-11-02 ENCOUNTER — Other Ambulatory Visit: Payer: Self-pay

## 2014-11-02 DIAGNOSIS — I493 Ventricular premature depolarization: Secondary | ICD-10-CM | POA: Insufficient documentation

## 2014-11-02 DIAGNOSIS — G43909 Migraine, unspecified, not intractable, without status migrainosus: Secondary | ICD-10-CM | POA: Diagnosis not present

## 2014-11-02 DIAGNOSIS — R002 Palpitations: Secondary | ICD-10-CM | POA: Diagnosis present

## 2014-11-02 LAB — BASIC METABOLIC PANEL
ANION GAP: 11 (ref 5–15)
BUN: 11 mg/dL (ref 6–20)
CHLORIDE: 101 mmol/L (ref 101–111)
CO2: 22 mmol/L (ref 22–32)
CREATININE: 0.73 mg/dL (ref 0.44–1.00)
Calcium: 8.9 mg/dL (ref 8.9–10.3)
GFR calc non Af Amer: >60 mL/min (ref >60)
GLUCOSE: 98 mg/dL (ref 65–99)
POTASSIUM: 3.6 mmol/L (ref 3.5–5.1)
Sodium: 134 mmol/L — ABNORMAL LOW (ref 135–145)

## 2014-11-02 LAB — CBC
HCT: 36.4 % (ref 36.0–46.0)
HEMOGLOBIN: 12.1 g/dL (ref 12.0–15.0)
MCH: 30.3 pg (ref 26.0–34.0)
MCHC: 33.2 g/dL (ref 30.0–36.0)
MCV: 91.2 fL (ref 78.0–100.0)
PLATELETS: 255 10*3/uL (ref 150–400)
RBC: 3.99 MIL/uL (ref 3.87–5.11)
RDW: 12.6 % (ref 11.5–15.5)
WBC: 7.8 10*3/uL (ref 4.0–10.5)

## 2014-11-02 LAB — I-STAT TROPONIN, ED: TROPONIN I, POC: 0 ng/mL (ref 0.00–0.08)

## 2014-11-02 NOTE — ED Notes (Signed)
Pt. reports chronic palpitations for 2 weeks with mild chest tightness , denies nausea or diaphoresis . No SOB or fever.

## 2014-11-03 ENCOUNTER — Telehealth: Payer: Self-pay

## 2014-11-03 ENCOUNTER — Emergency Department (HOSPITAL_COMMUNITY)
Admission: EM | Admit: 2014-11-03 | Discharge: 2014-11-03 | Disposition: A | Discharge status: Home / Self Care | Payer: BLUE CROSS/BLUE SHIELD | Attending: Emergency Medicine | Admitting: Emergency Medicine

## 2014-11-03 DIAGNOSIS — R0789 Other chest pain: Secondary | ICD-10-CM

## 2014-11-03 DIAGNOSIS — R002 Palpitations: Secondary | ICD-10-CM

## 2014-11-03 DIAGNOSIS — I493 Ventricular premature depolarization: Secondary | ICD-10-CM

## 2014-11-03 MED ORDER — LORAZEPAM 0.5 MG PO TABS: 0.5000 mg | ORAL_TABLET | Freq: Every day | ORAL | Status: DC

## 2014-11-03 NOTE — ED Provider Notes (Signed)
CSN: [id number]     Arrival date & time 11/02/14  2221 History   First MD Initiated Contact with Patient 11/03/14 0002     Chief Complaint  Patient presents with  . Palpitations     (Consider location/radiation/quality/duration/timing/severity/associated sxs/prior Treatment) HPI Patient presents to the emergency department with palpitations that have been ongoing for almost 2 weeks.  The patient states that she started having palpitations last Sunday and she is followed up with her primary care doctor.  They did basic laboratory testing and they referred her to cardiology.  She states she has a cardiology appointment at the end of June.  She states the palpitations continued, syncope, worse when she lays down at night.  Patient states nothing seems make her condition better.  She has cut out caffeine from her daily routine.  The patient states that she does not have any nausea, vomiting, weakness, dizziness, headache, blurred vision, back pain, neck pain, shortness of breath, cough, fever, rash, or syncope.  The patient states that she has had some intermittent chest discomfort Past Medical History  Diagnosis Date  . Migraines   . Allergy    Past Surgical History  Procedure Laterality Date  . Cesarean section    . Wisdom tooth extraction     Family History  Problem Relation Age of Onset  . Hypertension Mother   . Cancer Mother   . Hyperlipidemia Father   . Arthritis Father   . Cancer Maternal Grandmother   . Cancer Paternal Grandmother   . Heart disease Paternal Grandfather    History  Substance Use Topics  . Smoking status: Never Smoker   . Smokeless tobacco: Never Used  . Alcohol Use: No   OB History    No data available     Review of Systems All other systems negative except as documented in the HPI. All pertinent positives and negatives as reviewed in the HPI.   Allergies  Meperidine hcl and Sulfonamide derivatives  Home Medications   Prior to Admission  medications   Medication Sig Start Date End Date Taking? Authorizing Provider  cetirizine (ZYRTEC) 10 MG tablet Take 10 mg by mouth as needed for allergies.   Yes Historical Provider, MD  Magnesium 300 MG CAPS Take 300 mg by mouth every other day.   Yes Historical Provider, MD  rizatriptan (MAXALT) 10 MG tablet Take 10 mg by mouth as needed for migraine. May repeat in 2 hours if needed   Yes Historical Provider, MD   BP 126/82 mmHg  Pulse 77  Temp(Src) 99 F (37.2 C) (Oral)  Resp 17  SpO2 99%  LMP 08/29/2014 Physical Exam  Constitutional: She is oriented to person, place, and time. She appears well-developed and well-nourished. No distress.  HENT:  Head: Normocephalic and atraumatic.  Mouth/Throat: Oropharynx is clear and moist.  Eyes: Pupils are equal, round, and reactive to light.  Neck: Normal range of motion. Neck supple.  Cardiovascular: Normal rate, regular rhythm and normal heart sounds.  Exam reveals no gallop and no friction rub.   No murmur heard. Pulmonary/Chest: Effort normal and breath sounds normal. No respiratory distress.  Abdominal: Soft. Bowel sounds are normal.  Neurological: She is alert and oriented to person, place, and time. She exhibits normal muscle tone. Coordination normal.  Skin: Skin is warm and dry.  Psychiatric: She has a normal mood and affect. Her behavior is normal.  Nursing note and vitals reviewed.   ED Course  Procedures (including critical care time) Labs Review  Labs Reviewed  BASIC METABOLIC PANEL - Abnormal; Notable for the following:    Sodium 134 (*)    All other components within normal limits  CBC  I-STAT TROPOININ, ED    Imaging Review Dg Chest 2 View  11/03/2014   CLINICAL DATA:  Chest tightness.  EXAM: CHEST  2 VIEW  COMPARISON:  04/26/2013  FINDINGS: Unchanged cardiac silhouette and mediastinal contours. Evaluation of the retrosternal clear space obscured secondary to overlying soft tissues. No focal parenchymal opacities. No  pleural effusion or pneumothorax. No evidence of edema. No acute osseus abnormalities. There is mild gas distention of multiple loops of large and small bowel within the image upper abdomen.  IMPRESSION: No acute cardiopulmonary disease.   Electronically Signed   By: Sandi Mariscal M.D.   On: 11/03/2014 00:11     EKG Interpretation   Date/Time:  Thursday Nov 02 2014 22:27:02 EDT Ventricular Rate:  83 PR Interval:  138 QRS Duration: 84 QT Interval:  366 QTC Calculation: 430 R Axis:   52 Text Interpretation:  Sinus rhythm with occasional Premature ventricular  complexes Nonspecific ST abnormality Abnormal ECG Confirmed by Lita Mains   MD, DAVID ([id number]) on 11/03/2014 12:38:11 AM     Patient be referred to [facility] cardiology for follow-up and evaluation of these palpitations and PVCs.  The patient is advised plan and all questions were answered.  The patient has PVCs noted on her EKG, but no elevated troponin.  Her chest x-ray does not show any abnormality   Dalia Heading, PA-C 11/03/14 0623  Julianne Rice, MD 11/03/14 [phone]

## 2014-11-03 NOTE — Discharge Instructions (Signed)
Return here as needed.  Follow-up with the cardiologist provided.  Increase your fluid intake and rest as much possible

## 2014-11-03 NOTE — Telephone Encounter (Signed)
PLEASE NOTE: All timestamps contained within this report are represented as [place] Standard Time. CONFIDENTIALTY NOTICE: This fax transmission is intended only for the addressee. It contains information that is legally privileged, confidential or otherwise protected from use or disclosure. If you are not the intended recipient, you are strictly prohibited from reviewing, disclosing, copying using or disseminating any of this information or taking any action in reliance on or regarding this information. If you have received this fax in error, please notify us immediately by telephone so that we can arrange for its return to [place]. Phone: [phone], Toll-Free: [phone], Fax: [phone] Page: 1 of 2 Call Id: [id number] [place] Patient Name: Jamie [name] Gender: Female DOB: 08/31/1975 Age: 39 Y 1 M 16 D Return Phone Number: [phone] (Primary), [phone] (Secondary) Address: declined City/State/Zip: [place] Client [place] Night - Client Client Site [facility], [place] Contact Type Call Call Type Triage / Clinical Relationship To Patient Self Return Phone Number [phone] (Primary) Chief Complaint Heart palpitations or irregular heartbeat Initial Comment Caller States heart palpations, tired, nausea PreDisposition Go to ED Nurse Assessment Nurse: Donovan Kail, RN, Barnetta Chapel Date/Time (Eastern Time): 11/02/2014 9:39:33 PM Confirm and document reason for call. If symptomatic, describe symptoms. ---Caller States heart palpations, tired, nausea . This has been going on since last Monday. She was referred to cardiologist. She feels really tired. It woke her up last night. She isn't eating much today. Good fluid intake. No coffee in a week. Has the patient traveled out of the country within the last 30 days? ---Not  Applicable Does the patient require triage? ---Yes Related visit to physician within the last 2 weeks? ---Yes Does the PT have any chronic conditions? (i.e. diabetes, asthma, etc.) ---No Did the patient indicate they were pregnant? ---No Guidelines Guideline Title Affirmed Question Affirmed Notes Nurse Date/Time (Eastern Time) Heart Rate and Heartbeat Questions [1] Skipped or extra beat(s) AND [2] occurs 4 or more times per minute Donovan Kail, RN, Barnetta Chapel 11/02/2014 9:40:21 PM Disp. Time Eilene Ghazi Time) Disposition Final User 11/02/2014 9:47:45 PM See Physician within 4 Hours (or PCP triage) Yes Donovan Kail, RN, Ledell Noss Understands: Yes Disagree/Comply: Disagree Disagree/Comply Reason: Wait and see Care Advice Given Per Guideline PLEASE NOTE: All timestamps contained within this report are represented as [place] Standard Time. CONFIDENTIALTY NOTICE: This fax transmission is intended only for the addressee. It contains information that is legally privileged, confidential or otherwise protected from use or disclosure. If you are not the intended recipient, you are strictly prohibited from reviewing, disclosing, copying using or disseminating any of this information or taking any action in reliance on or regarding this information. If you have received this fax in error, please notify us immediately by telephone so that we can arrange for its return to [place]. Phone: [phone], Toll-Free: [phone], Fax: [phone] Page: 2 of 2 Call Id: [id number] Care Advice Given Per Guideline SEE PHYSICIAN WITHIN 4 HOURS (or PCP triage): * IF NO PCP TRIAGE: You need to be seen. Go to _______________ (ED/UCC or office if it will be open) within the next 3 or 4 hours. Go sooner if you become worse. BRING MEDICINES: * Please bring a list of your current medicines when you go to see the doctor. * It is also a good idea to bring the pill bottles too. This will help the doctor to make certain you are  taking the  right medicines and the right dose. CALL BACK IF: * You become worse. CARE ADVICE given per Palpitations (Adult) guideline. 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 She will wait and see if any of her symptoms get worse to go to the ER. Referrals GO TO FACILITY REFUSED

## 2014-11-03 NOTE — Telephone Encounter (Signed)
Pt has note in chart seen Percival on 11/03/14.

## 2014-11-29 ENCOUNTER — Ambulatory Visit (INDEPENDENT_AMBULATORY_CARE_PROVIDER_SITE_OTHER): Payer: BLUE CROSS/BLUE SHIELD | Admitting: Cardiology

## 2014-11-29 ENCOUNTER — Encounter: Payer: Self-pay | Admitting: Cardiology

## 2014-11-29 VITALS — BP 120/78 | HR 87 | Ht 65.0 in | Wt 178.0 lb

## 2014-11-29 DIAGNOSIS — R002 Palpitations: Secondary | ICD-10-CM | POA: Diagnosis not present

## 2014-11-29 HISTORY — DX: Palpitations: R00.2

## 2014-11-29 NOTE — Patient Instructions (Signed)
Medication Instructions:  Your physician recommends that you continue on your current medications as directed. Please refer to the Current Medication list given to you today.   Labwork: none  Testing/Procedures: Your physician has requested that you have an exercise tolerance test.     Your physician has recommended that you wear a holter monitor. Holter monitors are medical devices that record the heart's electrical activity. Doctors most often use these monitors to diagnose arrhythmias. Arrhythmias are problems with the speed or rhythm of the heartbeat. The monitor is a small, portable device. You can wear one while you do your normal daily activities. This is usually used to diagnose what is causing palpitations/syncope (passing out).    Follow-Up: Your physician recommends that you schedule a follow-up appointment in: 3 weeks with Dr. Ellyn Hack   Any Other Special Instructions Will Be Listed Below (If Applicable).  Exercise Stress Electrocardiogram An exercise stress electrocardiogram is a test that is done to evaluate the blood supply to your heart. This test may also be called exercise stress electrocardiography. The test is done while you are walking on a treadmill. The goal of this test is to raise your heart rate. This test is done to find areas of poor blood flow to the heart by determining the extent of coronary artery disease (CAD).   CAD is defined as narrowing in one or more heart (coronary) arteries of more than 70%. If you have an abnormal test result, this may mean that you are not getting adequate blood flow to your heart during exercise. Additional testing may be needed to understand why your test was abnormal. LET [facility] CARE PROVIDER KNOW ABOUT:   Any allergies you have.  All medicines you are taking, including vitamins, herbs, eye drops, creams, and over-the-counter medicines.  Previous problems you or members of your family have had with the use of  anesthetics.  Any blood disorders you have.  Previous surgeries you have had.  Medical conditions you have.  Possibility of pregnancy, if this applies. RISKS AND COMPLICATIONS Generally, this is a safe procedure. However, as with any procedure, complications can occur. Possible complications can include:  Pain or pressure in the following areas:  Chest.  Jaw or neck.  Between your shoulder blades.  Radiating down your left arm.  Dizziness or light-headedness.  Shortness of breath.  Increased or irregular heartbeats.  Nausea or vomiting.  Heart attack (rare). BEFORE THE PROCEDURE  Avoid all forms of caffeine 24 hours before your test or as directed by your health care provider. This includes coffee, tea (even decaffeinated tea), caffeinated sodas, chocolate, cocoa, and certain pain medicines.  Follow your health care provider's instructions regarding eating and drinking before the test.  Take your medicines as directed at regular times with water unless instructed otherwise. Exceptions may include:  If you have diabetes, ask how you are to take your insulin or pills. It is common to adjust insulin dosing the morning of the test.  If you are taking beta-blocker medicines, it is important to talk to your health care provider about these medicines well before the date of your test. Taking beta-blocker medicines may interfere with the test. In some cases, these medicines need to be changed or stopped 24 hours or more before the test.  If you wear a nitroglycerin patch, it may need to be removed prior to the test. Ask your health care provider if the patch should be removed before the test.  If you use an inhaler for  any breathing condition, bring it with you to the test.  If you are an outpatient, bring a snack so you can eat right after the stress phase of the test.  Do not smoke for 4 hours prior to the test or as directed by your health care provider.  Do not apply  lotions, powders, creams, or oils on your chest prior to the test.  Wear loose-fitting clothes and comfortable shoes for the test. This test involves walking on a treadmill. PROCEDURE  Multiple patches (electrodes) will be put on your chest. If needed, small areas of your chest may have to be shaved to get better contact with the electrodes. Once the electrodes are attached to your body, multiple wires will be attached to the electrodes and your heart rate will be monitored.  Your heart will be monitored both at rest and while exercising.  You will walk on a treadmill. The treadmill will be started at a slow pace. The treadmill speed and incline will gradually be increased to raise your heart rate. AFTER THE PROCEDURE  Your heart rate and blood pressure will be monitored after the test.  You may return to your normal schedule including diet, activities, and medicines, unless your health care provider tells you otherwise. Document Released: 05/23/2000 Document Revised: 05/31/2013 Document Reviewed: 01/31/2013 [facility] Patient Information 2015 [place], [place]. This information is not intended to replace advice given to you by your health care provider. Make sure you discuss any questions you have with your health care provider. Holter Monitoring A Holter monitor is a small device with electrodes (small sticky patches) that attach to your chest. It records the electrical activity of your heart and is worn continuously for 24-48 hours.  A HOLTER MONITOR IS USED TO  Detect heart problems such as:  Heart arrhythmia. Is an abnormal or irregular heartbeat. With some heart arrhythmias, you may not feel or know that you have an irregular heart rhythm.  Palpitations, such as feeling your heart racing or fluttering. It is possible to have heart palpitations and not have a heart arrhythmia.  A heart rhythm that is too slow or too fast.  If you have problems fainting, near fainting or feeling  light-headed, a Holter monitor may be worn to see if your heart is the cause. HOLTER MONITOR PREPARATION   Electrodes will be attached to the skin on your chest.  If you have hair on your chest, small areas may have to be shaved. This is done to help the patches stick better and make the recording more accurate.  The electrodes are attached by wires to the Holter monitor. The Holter monitor clips to your clothing. You will wear the monitor at all times, even while exercising and sleeping. HOME CARE INSTRUCTIONS   Wear your monitor at all times.  The wires and the monitor must stay dry. Do not get the monitor wet.  Do not bathe, swim or use a hot tub with it on.  You may do a "sponge" bath while you have the monitor on.  Keep your skin clean, do not put body lotion or moisturizer on your chest.  It's possible that your skin under the electrodes could become irritated. To keep this from happening, you may put the electrodes in slightly different places on your chest.  Your caregiver will also ask you to keep a diary of your activities, such as walking or doing chores. Be sure to note what you are doing if you experience heart symptoms such as  palpitations. This will help your caregiver determine what might be contributing to your symptoms. The information stored in your monitor will be reviewed by your caregiver alongside your diary entries.  Make sure the monitor is safely clipped to your clothing or in a location close to your body that your caregiver recommends.  The monitor and electrodes are removed when the test is over. Return the monitor as directed.  Be sure to follow up with your caregiver and discuss your Holter monitor results. SEEK IMMEDIATE MEDICAL CARE IF:  You faint or feel lightheaded.  You have trouble breathing.  You get pain in your chest, upper arm or jaw.  You feel sick to your stomach and your skin is pale, cool, or damp.  You think something is wrong with  the way your heart is beating. MAKE SURE YOU:   Understand these instructions.  Will watch your condition.  Will get help right away if you are not doing well or get worse. Document Released: 02/22/2004 Document Revised: 08/18/2011 Document Reviewed: 07/06/2008 [facility] Patient Information 2015 [place], [place]. This information is not intended to replace advice given to you by your health care provider. Make sure you discuss any questions you have with your health care provider.

## 2014-11-29 NOTE — Progress Notes (Signed)
PATIENT: Jamie [name] MRN: [id number] DOB: 1975/07/22 PCP: Arnette Norris, MD  Clinic Note: Chief Complaint  Patient presents with  . other    Ref by Dr. Glori Bickers for palpitations. Meds reviewed by the patient verbally. Patient c/o palpitations that started on Oct 22, 2014; more frequent palpitations lately.     HPI: Jamie [name] is a 39 y.o. female with a PMH below who presents today for evaluation of intermittent palpitations. She otherwise really has only a past history of anemia and migraine headaches..  Interval History: She says that she started palpitations on back in Maystarting on the 15th. She had had a large meal of [place] food the previous day. She was found to have PVCs and was thought to be related to a reaction to MSG. She was told reduced caffeine and avoid MSG, despite this she continues to palpitations that seem to have increased in frequency and intensity. The symptoms got bad enough that she went to emergency room on the 26th of May.  Her EKG today just basically show sinus rhythm with occasional PVCs and nonspecific ST and T wave changes. Is not any associated syncope or near sleeping, but does note a little lightheadedness but she has a lot of PVCs. She does describe a sensation of chest tightness or pressure or discomfort with the exception of maybe just the irregular heartbeat sensation.these episodes oftentimes make her feel as though she has a sensation of coughing.  She has reduced her caffeine intake and avoid adenosine but no improvement of symptoms. Actually over last week or so for symptoms have improved somewhat but still having several times throughout the course of the day. She denies any prolonged episodes of palpitations but she simply note feeling a heavy pounding beat intermittently visible last for couple minutes and then go away.  The remainder of cardiac review of systems is as follows: Cardiovascular ROS: positive for - irregular heartbeat and  palpitations negative for - chest pain, dyspnea on exertion, edema, loss of consciousness, murmur, orthopnea, paroxysmal nocturnal dyspnea, rapid heart rate, shortness of breath or Syncope/near syncope, TIA/amaurosis fugax :   Past Medical History  Diagnosis Date  . Migraines   . Allergy     Prior Cardiac Evaluation and Past Surgical History: Past Surgical History  Procedure Laterality Date  . Cesarean section    . Wisdom tooth extraction      Allergies  Allergen Reactions  . Meperidine Hcl     REACTION: increased BP \T\ Pulse  . Sulfonamide Derivatives     REACTION: rash    Current Outpatient Prescriptions  Medication Sig Dispense Refill  . cetirizine (ZYRTEC) 10 MG tablet Take 10 mg by mouth as needed for allergies.    . Magnesium 300 MG CAPS Take 300 mg by mouth every other day.    . rizatriptan (MAXALT) 10 MG tablet Take 10 mg by mouth as needed for migraine. May repeat in 2 hours if needed     No current facility-administered medications for this visit.    History   Social History  . Marital Status: Married    Spouse Name: N/A  . Number of Children: N/A  . Years of Education: N/A   Social History Main Topics  . Smoking status: Never Smoker   . Smokeless tobacco: Never Used  . Alcohol Use: No  . Drug Use: No  . Sexual Activity: Yes   Other Topics Concern  . None   Social History Narrative   Family History:  family history includes Arthritis in her father; Cancer in her maternal grandmother, mother, and paternal grandmother; Heart disease in her paternal grandfather; Hyperlipidemia in her father; Hypertension in her mother.  ROS: A comprehensive Review of Systems - was performed Review of Systems  Constitutional: Negative.   Respiratory: Negative.   Cardiovascular: Negative for claudication and leg swelling.       Per history of present illness  Genitourinary:       As noted that her menstrual cycles has not been stable over the last few months.   Musculoskeletal: Negative.   Neurological: Positive for headaches (Has cyclical, menstrual cycle related migraine headaches).  Psychiatric/Behavioral: The patient is not nervous/anxious.   All other systems reviewed and are negative.   PHYSICAL EXAM BP 120/78 mmHg  Pulse 87  Ht 5' 5"  (1.651 m)  Wt 80.74 kg (178 lb)  BMI 29.62 kg/m2  LMP 08/29/2014 General appearance: alert, cooperative, appears stated age, no distress and Present mood and affect. Well-nourished/well-groomed Neck: no adenopathy, no carotid bruit, no JVD, supple, symmetrical, trachea midline and thyroid not enlarged, symmetric, no tenderness/mass/nodules Lungs: clear to auscultation bilaterally, normal percussion bilaterally and Nonlabored, good air movement Heart: RRR, normal S1 and S2. Intermittent ectopy. No M./R./G. Nondisplaced PMI. Abdomen: soft, non-tender; bowel sounds normal; no masses,  no organomegaly Extremities: extremities normal, atraumatic, no cyanosis or edema Pulses: 2+ and symmetric Skin: Skin color, texture, turgor normal. No rashes or lesions Neurologic: Alert and oriented X 3, normal strength and tone. Normal symmetric reflexes. Normal coordination and gait Cranial nerves: normal   Adult ECG Report  Rate: 87 ;  Rhythm: normal sinus rhythm and Normal axis, intervals and durations. Normal voltage  Narrative Interpretation: normal EKG  Recent Labs:    Chemistry      Component Value Date/Time   NA 134* 11/02/2014 2240   K 3.6 11/02/2014 2240   CL 101 11/02/2014 2240   CO2 22 11/02/2014 2240   BUN 11 11/02/2014 2240   CREATININE 0.73 11/02/2014 2240      Component Value Date/Time   CALCIUM 8.9 11/02/2014 2240   ALKPHOS 64 10/23/2014 1005   AST 16 10/23/2014 1005   ALT 12 10/23/2014 1005   BILITOT 0.4 10/23/2014 1005     No results found for: CHOL, HDL, LDLCALC, LDLDIRECT, TRIG, CHOLHDL   ASSESSMENT / PLAN: Very pleasant young woman who is otherwise totally healthy presenting with  palpitations. Has an EKG showing PVCs and sounds of any PVCs on exam. The heavy pounding sensation sounds like post PVC pause related secondary strong beat.  Problem List Items Addressed This Visit    Palpitations - Primary    Most likely PVCs based on findings on EKG. I mentioned these things tend to happen in bursts. If they're getting better now needs to be very likely may be self-limiting.  Continue to cut down coffee. Moderation and MSG makes sense. I insure adequate hydration and nutrition. Other chemistries looked fine and normal TSH.  Plan:   GXT: to determine if PVCs increase in frequency / intensity with exertion   48 hr Holter Monitor: to confirm PVCs, PACs or PAT  Consider PRN BB or CCB depending on monitor findings.  If significant PVC burden, would consider Echo.  Otherwise, in the absence of abnormal exam findings, would not check Echo for now.      Relevant Orders   EKG 12-Lead (Completed)   Holter monitor - 48 hour   Exercise Tolerance Test  No orders of the defined types were placed in this encounter.    Followup: 3-4 weeks   DAVID W. Ellyn Hack, M.D., M.S. Interventional Cardiolgy [facility]

## 2014-12-01 NOTE — Assessment & Plan Note (Addendum)
Most likely PVCs based on findings on EKG. I mentioned these things tend to happen in bursts. If they're getting better now needs to be very likely may be self-limiting.  Continue to cut down coffee. Moderation and MSG makes sense. I insure adequate hydration and nutrition. Other chemistries looked fine and normal TSH.  Plan:   GXT: to determine if PVCs increase in frequency / intensity with exertion   48 hr Holter Monitor: to confirm PVCs, PACs or PAT  Consider PRN BB or CCB depending on monitor findings.  If significant PVC burden, would consider Echo.  Otherwise, in the absence of abnormal exam findings, would not check Echo for now.

## 2014-12-05 ENCOUNTER — Ambulatory Visit
Admission: RE | Admit: 2014-12-05 | Discharge: 2014-12-05 | Disposition: A | Discharge status: Home / Self Care | Payer: BLUE CROSS/BLUE SHIELD | Source: Ambulatory Visit | Attending: Cardiology | Admitting: Cardiology

## 2014-12-05 DIAGNOSIS — R002 Palpitations: Secondary | ICD-10-CM | POA: Insufficient documentation

## 2014-12-14 ENCOUNTER — Ambulatory Visit (INDEPENDENT_AMBULATORY_CARE_PROVIDER_SITE_OTHER): Payer: BLUE CROSS/BLUE SHIELD | Admitting: Cardiovascular Disease

## 2014-12-14 DIAGNOSIS — R002 Palpitations: Secondary | ICD-10-CM | POA: Diagnosis not present

## 2014-12-14 HISTORY — PX: OTHER SURGICAL HISTORY: SHX169

## 2014-12-14 LAB — EXERCISE TOLERANCE TEST
CSEPED: 9 min
Exercise duration (sec): 0 s

## 2015-01-02 ENCOUNTER — Encounter: Payer: Self-pay | Admitting: Cardiology

## 2015-01-02 NOTE — Progress Notes (Signed)
PATIENT: Jamie [name] MRN: [id number] DOB: 13-Dec-1975 PCP: Arnette Norris, MD  Clinic Note: Chief Complaint  Patient presents with  . other    F/u treadmill and holter. Meds reviewed verbally with pt.    HPI: Jamie [name] is a 39 y.o. female with a PMH below who presents today for followup of initial evaluation of intermittent palpitations. She otherwise really has only a past history of anemia and migraine headaches..  I saw her in June, 22nd for episodes of palpitations starting back in May. This was following a large meal of [place] food. She was noted to PVCs thought to be related to MSG. Despite reducing caffeine and MSG she still is at palpitations. She had an EKG in the emergency room that showed sinus rhythm occasional PVCs and nonspecific ST and T wave changes. She was told with the cutting out caffeine and avoid MSG. She had a GXT and 48hr monitor:  GXT 12/14/2014:exercise for 9 minutes. No symptoms. No arrhythmias. No EKG changes. Low-risk  Holter monitor: Max/min heart rate 52-151 with average 82. Some sinus bradycardia and some sinus tachycardia. Rare PVCs; occasional PACs with 7 runs of roughly 3 beats. No arrhythmias.  Interval History: she says her episodes of actually diminished recently, still has some of her palpitations felt that bother her still.   No associated syncope or near sleeping, but does note a little lightheadedness during episodes when she has lots of the palpitations.palpitations are often associated with strains discomfort in her chest that she thinks is just at the strange sensation of the ring. She continues to curtail her caffeine and high sugar content foods.  She is not having prolonged episodes of palpitations just fleeting spurts hearing air. Nothing more than a minute or 2 at the most. This is not persistent for popping for a minute but does notes several blood clots over the course of a few minutes.  The remainder of cardiac review of systems  is as follows: Cardiovascular ROS: positive for - irregular heartbeat and palpitations negative for - chest pain, dyspnea on exertion, edema, loss of consciousness, murmur, orthopnea, paroxysmal nocturnal dyspnea, rapid heart rate, shortness of breath or Syncope/near syncope, TIA/amaurosis fugax :   Past Medical History  Diagnosis Date  . Migraines   . Allergy   . Palpitations 11/29/2014    Holter monitor: Max/min heart rate 52-151 with average 82. Some sinus bradycardia and some sinus tachycardia. Rare PVCs; occasional PACs with 7 runs of roughly 3 beats. No arrhythmias.    Prior Cardiac Evaluation and Past Surgical History: Past Surgical History  Procedure Laterality Date  . Cesarean section    . Wisdom tooth extraction    . Exercise tolerance test  12/14/2014    exercise for 9 minutes. No symptoms. No arrhythmias. No EKG changes. Low-risk    Allergies  Allergen Reactions  . Meperidine Hcl     REACTION: increased BP \T\ Pulse  . Sulfonamide Derivatives     REACTION: rash    Current Outpatient Prescriptions  Medication Sig Dispense Refill  . cetirizine (ZYRTEC) 10 MG tablet Take 10 mg by mouth as needed for allergies.    . Magnesium 300 MG CAPS Take 300 mg by mouth every other day.    . rizatriptan (MAXALT) 10 MG tablet Take 10 mg by mouth as needed for migraine. May repeat in 2 hours if needed    . metoprolol tartrate (LOPRESSOR) 25 MG tablet Take 1 tablet (25 mg total) by mouth 3 (three)  times daily as needed. Take 1/2-1 tablet every 8hrs as needed for palpitations 180 tablet 3   No current facility-administered medications for this visit.    History   Social History  . Marital Status: Married    Spouse Name: N/A  . Number of Children: N/A  . Years of Education: N/A   Social History Main Topics  . Smoking status: Never Smoker   . Smokeless tobacco: Never Used  . Alcohol Use: No  . Drug Use: No  . Sexual Activity: Yes   Other Topics Concern  . None   Social  History Narrative   Family History: family history includes Arthritis in her father; Cancer in her maternal grandmother, mother, and paternal grandmother; Heart disease in her paternal grandfather; Hyperlipidemia in her father; Hypertension in her mother.  ROS: A comprehensive Review of Systems - was performed Review of Systems  Constitutional: Negative.   Respiratory: Negative.   Cardiovascular: Negative for claudication and leg swelling.       Per history of present illness  Genitourinary:       As noted that her menstrual cycles has not been stable over the last few months.  Musculoskeletal: Negative.   Neurological: Positive for headaches (Has cyclical, menstrual cycle related migraine headaches).  Psychiatric/Behavioral: The patient is not nervous/anxious.   All other systems reviewed and are negative.   PHYSICAL EXAM BP 130/84 mmHg  Pulse 84  Ht 5' 5"  (1.651 m)  Wt 81.08 kg (178 lb 12 oz)  BMI 29.75 kg/m2 General appearance: alert, cooperative, appears stated age, no distress and Present mood and affect. Well-nourished/well-groomed Neck: supple, no LAD her JVD. No carotid bruit. Lungs: clear to auscultation bilaterally, normal percussion bilaterally and Nonlabored, good air movement Heart: RRR, normal S1 and S2. Intermittent ectopy. No M./R./G. Nondisplaced PMI. Abdomen: soft, non-tender; bowel sounds normal; no masses,  no organomegaly Extremities: extremities normal, atraumatic, no cyanosis or edema Pulses: 2+ and symmetric Skin: Skin color, texture, turgor normal. No rashes or lesions Neurologic: Alert and oriented X 3, normal strength and tone. Normal symmetric reflexes. Normal coordination and gait Cranial nerves: normal   Adult ECG Report  Rate: 87 ;  Rhythm: normal sinus rhythm and Normal axis, intervals and durations. Normal voltage  Narrative Interpretation: normal EKG  Recent Labs:    Chemistry      Component Value Date/Time   NA 134* 11/02/2014 2240   K  3.6 11/02/2014 2240   CL 101 11/02/2014 2240   CO2 22 11/02/2014 2240   BUN 11 11/02/2014 2240   CREATININE 0.73 11/02/2014 2240      Component Value Date/Time   CALCIUM 8.9 11/02/2014 2240   ALKPHOS 64 10/23/2014 1005   AST 16 10/23/2014 1005   ALT 12 10/23/2014 1005   BILITOT 0.4 10/23/2014 1005     No results found for: CHOL, HDL, LDLCALC, LDLDIRECT, TRIG, CHOLHDL   ASSESSMENT / PLAN: Very pleasant young woman who is otherwise totally healthy presenting for followup of symptomatic palpitations. Mostly PACs and PVCs but no true arrhythmias. A couple atrial runs.  Problem List Items Addressed This Visit    Palpitations - Primary    Symptomatic PACs and PVCs. These come in spells. She is reluctant to use medications, but is quite symptomatic when they occur. They bother her. We discussed different options of how to treat this and I think the best option is to do when necessary beta blockers. She would basically take to the metoprolol as needed for her palpitation  symptoms and then to take it for a couple doses following that every 12 hours. We will start with 12.5 mg and see if that is helpful to avoid overmedication. She will keep these as projectile the pocket to use when her symptoms are most pronounced. This usually occurs in the evening which would be helpful because it may help with her settling down to sleep as well.         Meds ordered this encounter  Medications  . metoprolol tartrate (LOPRESSOR) 25 MG tablet    Sig: Take 1 tablet (25 mg total) by mouth 3 (three) times daily as needed. Take 1/2-1 tablet every 8hrs as needed for palpitations    Dispense:  180 tablet    Refill:  3   Medication Instructions:  Your physician has recommended you make the following change in your medication:  START taking metoprolol 84m 1/2-1 tablet every 8 hours as needed for palpitations   Labwork: none  Testing/Procedures: none   Follow-Up: Your physician wants you to follow-up  in: six months with Dr. HEllyn Hack  Daniele Yankowski W. HEllyn Hack M.D., M.S. Interventional Cardiolgy [facility]

## 2015-01-03 ENCOUNTER — Ambulatory Visit: Payer: BLUE CROSS/BLUE SHIELD | Admitting: Cardiovascular Disease

## 2015-01-03 ENCOUNTER — Encounter: Payer: Self-pay | Admitting: Cardiology

## 2015-01-03 ENCOUNTER — Ambulatory Visit (INDEPENDENT_AMBULATORY_CARE_PROVIDER_SITE_OTHER): Payer: BLUE CROSS/BLUE SHIELD | Admitting: Cardiology

## 2015-01-03 VITALS — BP 130/84 | HR 84 | Ht 65.0 in | Wt 178.8 lb

## 2015-01-03 DIAGNOSIS — R002 Palpitations: Secondary | ICD-10-CM | POA: Diagnosis not present

## 2015-01-03 MED ORDER — METOPROLOL TARTRATE 25 MG PO TABS
25.0000 mg | ORAL_TABLET | Freq: Three times a day (TID) | ORAL | Status: DC | PRN
Start: 2015-01-03 — End: 2015-01-29

## 2015-01-03 NOTE — Patient Instructions (Signed)
Medication Instructions:  Your physician has recommended you make the following change in your medication:  START taking metoprolol 13m 1/2-1 tablet every 8 hours as needed for palpitations   Labwork: none  Testing/Procedures: none  Follow-Up: Your physician wants you to follow-up in: six months with Dr. HEllyn Hack  You will receive a reminder letter in the mail two months in advance. If you don't receive a letter, please call our office to schedule the follow-up appointment.   Any Other Special Instructions Will Be Listed Below (If Applicable).

## 2015-01-05 ENCOUNTER — Encounter: Payer: Self-pay | Admitting: Cardiology

## 2015-01-05 NOTE — Assessment & Plan Note (Signed)
Symptomatic PACs and PVCs. These come in spells. She is reluctant to use medications, but is quite symptomatic when they occur. They bother her. We discussed different options of how to treat this and I think the best option is to do when necessary beta blockers. She would basically take to the metoprolol as needed for her palpitation symptoms and then to take it for a couple doses following that every 12 hours. We will start with 12.5 mg and see if that is helpful to avoid overmedication. She will keep these as projectile the pocket to use when her symptoms are most pronounced. This usually occurs in the evening which would be helpful because it may help with her settling down to sleep as well.

## 2015-01-22 ENCOUNTER — Telehealth: Payer: Self-pay | Admitting: *Deleted

## 2015-01-22 NOTE — Telephone Encounter (Signed)
S/w pt who was prescribted metoprolol PRN at 7/27 OV for palpitations at night. States she has only taken 12.34m for two nights. Reports more frequent palpitations during the day beginning last week along with some chest pain in the center of her chest for two days. Prior to that, states pain was under her arm.   Indicates pain comes and goes, not attributed to any activity. Goes away on its own. Denies left arm pain, nausea, sweating.  Did not take metoprolol during the day. Reports HR  In the 70s, BP 110s/70s-low80s. Took metoprolol 11pm last night. Reports awakened at 1:30am by palpitations. Went back to sleep at 2am. Has had palpitations today, has not taken metoprolol. Advised pt to take 1/2-1 tablet metoprolol q8hr PRN for palpitations as suggested and report back to [place].  Also advised pt that if increased CP, nausea, sweating, left arm pain, go directly to ER. Forward to Dr. HEllyn Hack

## 2015-01-22 NOTE — Telephone Encounter (Signed)
Pt was just put on new medication by Dr Ellyn Hack for Palpitations and usually pt take takes it at night. Now all the sudden it is coming more in the day time and she states she also has been having some chest pain here and there. Please advise.

## 2015-01-24 ENCOUNTER — Encounter: Payer: Self-pay | Admitting: Cardiology

## 2015-01-25 NOTE — Telephone Encounter (Signed)
Received a email from patient complaining of chest pain and palpitations.I called patient she stated she has been having chest pain and palpitations off and on for about 1&1/2 weeks.Stated she was unable to increase metoprolol due to low B/P.No chest pain at present.Dr.Harding out of office.No appointments available with Dr.Harding next week.Appointment scheduled with Ermalinda Barrios PA Monday 01/29/15 at 11:15 am at [facility] office.

## 2015-01-29 ENCOUNTER — Ambulatory Visit (INDEPENDENT_AMBULATORY_CARE_PROVIDER_SITE_OTHER): Payer: BLUE CROSS/BLUE SHIELD | Admitting: Physician Assistant

## 2015-01-29 ENCOUNTER — Encounter: Payer: Self-pay | Admitting: Physician Assistant

## 2015-01-29 VITALS — BP 116/78 | HR 84 | Ht 65.0 in | Wt 177.4 lb

## 2015-01-29 DIAGNOSIS — R0789 Other chest pain: Secondary | ICD-10-CM

## 2015-01-29 DIAGNOSIS — R002 Palpitations: Secondary | ICD-10-CM | POA: Diagnosis not present

## 2015-01-29 MED ORDER — METOPROLOL TARTRATE 25 MG PO TABS
ORAL_TABLET | ORAL | Status: DC
Start: 2015-01-29 — End: 2015-07-20

## 2015-01-29 NOTE — Telephone Encounter (Signed)
Pt has appt [facility] office today

## 2015-01-29 NOTE — Progress Notes (Signed)
Cardiology Office Note   Date:  01/29/2015   ID:  Jamie [name], DOB Apr 22, 1976, MRN [id number]  PCP:  Arnette Norris, MD  Cardiologist:  Dr. Ellyn Hack   Chief Complaint: Palpitations and chest pains    History of Present Illness: Jamie [name] is a 39 y.o. female who presents for worsening palpitations and chest pain. She saw him on 01/03/15 for palpitations and chest pain. She had a GXT 12/14/14 in which she exercised 9 minutes. No symptoms or arrhythmias or EKG changes. 48 hour Holter heart rate was 52-151 average 82 bpm rare PVC and occasional PACs with several runs of roughly 3 beats. No arrhythmias. He started her on metoprolol 12.5-25 mg when necessary.  The patient does not like taking medications and was not taking them at all. Her palpitations worsened and she started taking metoprolol 12.5 mg when necessary. She then increased it to twice a day. She says it has not helped her palpitations. She's also had some pain under her left arm and in the center of her chest that she has a hard time describing. It's kind of achy and is sometimes associated with the palpitations but not always. She works out at [employer] on [address] and does [employer] without any symptoms. She does admit to having a lot of stress recently because her brother has a brain tumor. She is wondering if this is causing most of her symptoms and should be treated. Patient didn't want to take extra metoprolol can she thought her blood pressure was getting too low and was told to keep it at 110 leg.    Past Medical History  Diagnosis Date  . Migraines   . Allergy   . Palpitations 11/29/2014    Holter monitor: Max/min heart rate 52-151 with average 82. Some sinus bradycardia and some sinus tachycardia. Rare PVCs; occasional PACs with 7 runs of roughly 3 beats. No arrhythmias.    Past Surgical History  Procedure Laterality Date  . Cesarean section    . Wisdom tooth extraction    . Exercise tolerance test  12/14/2014   exercise for 9 minutes. No symptoms. No arrhythmias. No EKG changes. Low-risk     Current Outpatient Prescriptions  Medication Sig Dispense Refill  . aspirin EC 81 MG tablet Take 81 mg by mouth daily.    . cetirizine (ZYRTEC) 10 MG tablet Take 10 mg by mouth as needed for allergies.    . Magnesium 300 MG CAPS Take 300 mg by mouth every other day.    . metoprolol tartrate (LOPRESSOR) 25 MG tablet Take 1 tablet (25 mg total) by mouth 3 (three) times daily as needed. Take 1/2-1 tablet every 8hrs as needed for palpitations 180 tablet 3  . rizatriptan (MAXALT) 10 MG tablet Take 10 mg by mouth as needed for migraine. May repeat in 2 hours if needed     No current facility-administered medications for this visit.    Allergies:   Meperidine hcl and Sulfonamide derivatives    Social History:  The patient  reports that she has never smoked. She has never used smokeless tobacco. She reports that she does not drink alcohol or use illicit drugs.   Family History:  The patient's    family history includes Arthritis in her father; Cancer in her maternal grandmother, mother, and paternal grandmother; Heart disease in her paternal grandfather; Hyperlipidemia in her father; Hypertension in her mother.    ROS:  Please see the history of present illness.   Otherwise,  review of systems are positive for none.   All other systems are reviewed and negative.    PHYSICAL EXAM: VS:  BP 116/78 mmHg  Pulse 84  Ht 5' 5"  (1.651 m)  Wt 177 lb 6.4 oz (80.468 kg)  BMI 29.52 kg/m2 , BMI Body mass index is 29.52 kg/(m^2). GEN: Well nourished, well developed, in no acute distress Neck: no JVD, HJR, carotid bruits, or masses Cardiac: RRR; no murmurs,gallop, rubs, thrill or heave,  Respiratory:  clear to auscultation bilaterally, normal work of breathing GI: soft, nontender, nondistended, + BS MS: no deformity or atrophy Extremities: without cyanosis, clubbing, edema, good distal pulses bilaterally.  Skin: warm and  dry, no rash Neuro:  Strength and sensation are intact    EKG:  EKG is ordered today. The ekg ordered today demonstrates normal sinus rhythm, normal EKG  Recent Labs: 10/23/2014: ALT 12; TSH 1.02 11/02/2014: BUN 11; Creatinine, Ser 0.73; Hemoglobin 12.1; Platelets 255; Potassium 3.6; Sodium 134*    Lipid Panel No results found for: CHOL, TRIG, HDL, CHOLHDL, VLDL, LDLCALC, LDLDIRECT    Wt Readings from Last 3 Encounters:  01/29/15 177 lb 6.4 oz (80.468 kg)  01/03/15 178 lb 12 oz (81.08 kg)  11/29/14 178 lb (80.74 kg)      Other studies Reviewed: Additional studies/ records that were reviewed today include and review of the records demonstrates:  GXT 12/14/2014:exercise for 9 minutes. No symptoms. No arrhythmias. No EKG changes. Low-risk Holter monitor: Max/min heart rate 52-151 with average 82. Some sinus bradycardia and some sinus tachycardia. Rare PVCs; occasional PACs with 7 runs of roughly 3 beats. No arrhythmias   ASSESSMENT AND PLAN: Palpitations Palpitations have not improved with the Toprol 12.5 mg twice a day. I had along discussion with the patient about trying diltiazem are increasing her metoprolol. She also has a lot of stress. She is a willing to try metoprolol 12.5 mg in the morning 20 5 in the evening. May take 25 mg twice a day if her blood pressure tolerates and stays above 767 systolic. Recommend seeing her primary care for her stress and anxiety. Follow-up with Dr. Ellyn Hack in 6 weeks. All labs from the emergency room including TSH were reviewed and normal.  Chest pain Chest pain is atypical. She had a normal GXT last month. Suspect a lot of her symptoms are coming from stress and anxiety. Follow-up with primary care for treatment.     Sumner Boast, PA-C  01/29/2015 11:36 AM    [facility] [place], [place], [place]  [address] Phone: [phone]; Fax: [phone]

## 2015-01-29 NOTE — Patient Instructions (Signed)
Medication Instructions:   START TAKING 12.5 MG OF METOPROLOL IN THE MORNING AND 25 MG IN THE EVENING    Labwork:  NONE ORDER TODAY'   Testing/Procedures:  NONE ORDER TODAY    Follow-Up:   IN  6 TO 8 WEEKS WITH DR HARDING IN [facility]    Any Other Special Instructions Will Be Listed Below (If Applicable).  FOLLOW UP WITH PRIMARY  DOCTER FOR ANXIETY

## 2015-01-29 NOTE — Assessment & Plan Note (Signed)
Palpitations have not improved with the Toprol 12.5 mg twice a day. I had along discussion with the patient about trying diltiazem are increasing her metoprolol. She also has a lot of stress. She is a willing to try metoprolol 12.5 mg in the morning 20 5 in the evening. May take 25 mg twice a day if her blood pressure tolerates and stays above 438 systolic. Recommend seeing her primary care for her stress and anxiety. Follow-up with Dr. Ellyn Hack in 6 weeks. All labs from the emergency room including TSH were reviewed and normal.

## 2015-01-29 NOTE — Assessment & Plan Note (Signed)
Chest pain is atypical. She had a normal GXT last month. Suspect a lot of her symptoms are coming from stress and anxiety. Follow-up with primary care for treatment.

## 2015-01-31 ENCOUNTER — Ambulatory Visit (INDEPENDENT_AMBULATORY_CARE_PROVIDER_SITE_OTHER): Payer: BLUE CROSS/BLUE SHIELD | Admitting: Primary Care

## 2015-01-31 ENCOUNTER — Encounter: Payer: Self-pay | Admitting: Primary Care

## 2015-01-31 VITALS — BP 122/78 | HR 75 | Temp 98.2°F | Ht 65.0 in | Wt 177.1 lb

## 2015-01-31 DIAGNOSIS — F418 Other specified anxiety disorders: Secondary | ICD-10-CM | POA: Diagnosis not present

## 2015-01-31 MED ORDER — ALPRAZOLAM 0.25 MG PO TABS: 0.2500 mg | ORAL_TABLET | Freq: Two times a day (BID) | ORAL | Status: DC | PRN

## 2015-01-31 NOTE — Patient Instructions (Signed)
You may take Alprazolam twice daily as needed for anxiety.  Stop by the front and speak with Rosaria Ferries regarding your referral to therapy.  Follow up with myself or PCP in 6 weeks for re-evaluation of anxiety.  It was a pleasure meeting you!

## 2015-01-31 NOTE — Progress Notes (Signed)
Pre visit review using our clinic review tool, if applicable. No additional management support is needed unless otherwise documented below in the visit note. 

## 2015-01-31 NOTE — Progress Notes (Signed)
Subjective:    Patient ID: Rael C Borgerding, female    DOB: Lavaeh 27, 1977, 39 y.o.   MRN: [id number]  HPI  Ms. Perl is a 39 year old female who presents today with a chief complaint of anxiety. Her symotoms began in May 2016 with palpitations. She was evaluated by cardiology and underwent testing including stress test and Holter monitoring. She presented for follow up with cards on Monday this week with more palpitations and chest pain. Her metoprolol was increased to 12.5 mg in the morning and 25 mg at night.   She is under a lot of personal stress and feels very anxious lately due to recent family illness and death. She admits to worry with anxiety and difficulty controlling these feelings some days of the week; occsaional difficulty sleeping at night, some irritability during the day, intermittent palpitations with chest pain. She is afraid she will not be able to hold herself together when her family needs her. She doesn't feel this way everyday, more so during specific situations. PHQ-2 score of 0. GAD 7 score of 12. She is not interested in daily medication and is open to speaking with therapy.  Review of Systems  Constitutional: Positive for fatigue.  Respiratory: Negative for shortness of breath.   Cardiovascular: Negative for chest pain and palpitations.  Psychiatric/Behavioral: Positive for sleep disturbance. Negative for suicidal ideas. The patient is nervous/anxious.        Past Medical History  Diagnosis Date  . Migraines   . Allergy   . Palpitations 11/29/2014    Holter monitor: Max/min heart rate 52-151 with average 82. Some sinus bradycardia and some sinus tachycardia. Rare PVCs; occasional PACs with 7 runs of roughly 3 beats. No arrhythmias.    Social History   Social History  . Marital Status: Married    Spouse Name: N/A  . Number of Children: N/A  . Years of Education: N/A   Occupational History  . Not on file.   Social History Main Topics  . Smoking status:  Never Smoker   . Smokeless tobacco: Never Used  . Alcohol Use: No  . Drug Use: No  . Sexual Activity: Yes   Other Topics Concern  . Not on file   Social History Narrative    Past Surgical History  Procedure Laterality Date  . Cesarean section    . Wisdom tooth extraction    . Exercise tolerance test  12/14/2014    exercise for 9 minutes. No symptoms. No arrhythmias. No EKG changes. Low-risk    Family History  Problem Relation Age of Onset  . Hypertension Mother   . Cancer Mother   . Hyperlipidemia Father   . Arthritis Father   . Cancer Maternal Grandmother   . Cancer Paternal Grandmother   . Heart disease Paternal Grandfather     Allergies  Allergen Reactions  . Meperidine Hcl     REACTION: increased BP \T\ Pulse  . Sulfonamide Derivatives     REACTION: rash    Current Outpatient Prescriptions on File Prior to Visit  Medication Sig Dispense Refill  . aspirin EC 81 MG tablet Take 81 mg by mouth daily.    . cetirizine (ZYRTEC) 10 MG tablet Take 10 mg by mouth as needed for allergies.    . Magnesium 300 MG CAPS Take 300 mg by mouth every other day.    . metoprolol tartrate (LOPRESSOR) 25 MG tablet TAKE HALF A TABLET IN MORNING  AND ONE WHOLE TABLET IN EVENING 180 tablet  3  . rizatriptan (MAXALT) 10 MG tablet Take 10 mg by mouth as needed for migraine. May repeat in 2 hours if needed     No current facility-administered medications on file prior to visit.    BP 122/78 mmHg  Pulse 75  Temp(Src) 98.2 F (36.8 C) (Oral)  Ht 5' 5"  (1.651 m)  Wt 177 lb 1.9 oz (80.341 kg)  BMI 29.47 kg/m2  SpO2 98%  LMP 01/15/2015    Objective:   Physical Exam  Constitutional: She is oriented to person, place, and time. She appears well-nourished.  Cardiovascular: Normal rate and regular rhythm.   Pulmonary/Chest: Effort normal and breath sounds normal.  Neurological: She is alert and oriented to person, place, and time.  Skin: Skin is warm and dry.  Psychiatric: She has a  normal mood and affect.          Assessment & Plan:

## 2015-01-31 NOTE — Assessment & Plan Note (Signed)
Recent stress of past 3 months with loss of family friend and her brother who is in critical condition. GAD 7 score of 12 today, doesn't feel that way daily and does not feel as though she needs daily medication. Referral made to therapy today. Temporary RX provided for low dose xanax to use PRN. Discussed that if she finds herself needing the Xanax frequently then will need to start with SSRI. Denies SI/HI. PHQ 2 score of 0.

## 2015-02-01 NOTE — Telephone Encounter (Signed)
I see that she was seen @ [place].  I was on Vacation when the initial call came - was not sure what else to tell her via telephone. Glad she had an appt to discuss - I wouldn't be back @ [facility] until next week.  [place]

## 2015-02-13 ENCOUNTER — Encounter: Payer: Self-pay | Admitting: Cardiology

## 2015-02-14 ENCOUNTER — Other Ambulatory Visit: Payer: Self-pay

## 2015-02-14 ENCOUNTER — Telehealth: Payer: Self-pay

## 2015-02-14 DIAGNOSIS — R002 Palpitations: Secondary | ICD-10-CM

## 2015-02-14 NOTE — Telephone Encounter (Signed)
Pt called, states she is having more frequent palpitations, states they are waking her up at night. States she talked with a friend who recommended she may see Dr. [name]. Please advise if she should do this.

## 2015-02-14 NOTE — Telephone Encounter (Signed)
Per verbal from Dr. Ellyn Hack, pt requesting referral to Dr. [name] and for echo. Orders placed Responded to pt via MyChart Will ask scheduling to call and set up

## 2015-02-15 ENCOUNTER — Ambulatory Visit: Payer: BLUE CROSS/BLUE SHIELD | Admitting: Psychology

## 2015-02-15 ENCOUNTER — Ambulatory Visit (INDEPENDENT_AMBULATORY_CARE_PROVIDER_SITE_OTHER): Payer: BLUE CROSS/BLUE SHIELD | Admitting: Psychology

## 2015-02-15 DIAGNOSIS — F4323 Adjustment disorder with mixed anxiety and depressed mood: Secondary | ICD-10-CM | POA: Diagnosis not present

## 2015-02-22 ENCOUNTER — Other Ambulatory Visit: Payer: Self-pay

## 2015-02-22 ENCOUNTER — Ambulatory Visit (INDEPENDENT_AMBULATORY_CARE_PROVIDER_SITE_OTHER): Payer: BLUE CROSS/BLUE SHIELD

## 2015-02-22 ENCOUNTER — Other Ambulatory Visit: Payer: Self-pay | Admitting: Cardiology

## 2015-02-22 DIAGNOSIS — R079 Chest pain, unspecified: Secondary | ICD-10-CM | POA: Diagnosis not present

## 2015-02-22 DIAGNOSIS — R002 Palpitations: Secondary | ICD-10-CM

## 2015-02-23 NOTE — Progress Notes (Signed)
Quick Note:  Echo results: Good news: Essentially normal echocardiogram and normal pump function and normal valve function. No signs to suggest heart attack.. EF: 55-60%. No regional wall motion abnormalities   ______

## 2015-02-27 ENCOUNTER — Telehealth: Payer: Self-pay | Admitting: *Deleted

## 2015-02-27 NOTE — Telephone Encounter (Signed)
-----   Message from Leonie Man, MD sent at 02/23/2015  6:27 PM EDT ----- Echo results: Good news: Essentially normal echocardiogram and normal pump function and normal valve function.  No signs to suggest heart attack.. EF: 55-60%. No regional wall motion abnormalities

## 2015-02-27 NOTE — Telephone Encounter (Signed)
Spoke to patient. Result given . Verbalized understanding  

## 2015-03-01 ENCOUNTER — Ambulatory Visit (INDEPENDENT_AMBULATORY_CARE_PROVIDER_SITE_OTHER): Payer: BLUE CROSS/BLUE SHIELD | Admitting: Psychology

## 2015-03-01 DIAGNOSIS — F4323 Adjustment disorder with mixed anxiety and depressed mood: Secondary | ICD-10-CM | POA: Diagnosis not present

## 2015-03-06 ENCOUNTER — Encounter: Payer: Self-pay | Admitting: Primary Care

## 2015-03-13 ENCOUNTER — Ambulatory Visit: Payer: BLUE CROSS/BLUE SHIELD | Admitting: Primary Care

## 2015-03-13 NOTE — Progress Notes (Signed)
PCP: Arnette Norris, MD  Clinic Note: Chief Complaint  Patient presents with  . other    F/u echo. Meds reviewed verbally with pt.  . Palpitations    HPI: Jamie [name] is a 39 y.o. female with a PMH below who presents today for follow-up of her symptomatically palpitations. I initially saw her back in June for palpitations. She had a GXT showing no arrhythmias or EKG changes with no ischemia at 9 minutes exercise. 48 hour Holter monitor showed rates anywhere from 52-151 bpm average of 82 bpm with rare PVCs and occasional PACs. She was started on essentially when necessary metoprolol. She sells quickly increased it to 12.5 mg twice a day.  She also had an echocardiogram performed that was essentially normal.  Valia C Hukill was last seen by me on July 27. She then saw Ermalinda Barrios, PA-C on August 22. At the time of her August admission evaluation she was reluctant to take any additional metoprolol because of concern for low blood pressure. The decision was made to convert to 12.5 mg metoprolol on the morning and 25 mg the evening with the plan to potentially titrated up to 25 twice a day.  Recent Hospitalizations: None  Studies Reviewed:   Echocardiogram 02/22/2015: EF 55-60%. Normal LV function with no regional wall motion abnormalities. No valve lesions.   Normal study.  Interval History: Palpitations have been a bit better with increased BB dose.  Has noted a tendency to have episodes after eating food with MSG. -- for past month, has been making an effort to avoid MSG & has not noted any episodes.  No lightheadedness, dizziness, weakness or syncope/near syncope.  No chest pain or shortness of breath with rest or exertion.  No PND, orthopnea or edema.  No TIA/amaurosis fugax symptoms.  ROS: A comprehensive was performed. Review of Systems  Cardiovascular: Positive for palpitations (Much less prevalent as noted in HPI). Negative for claudication.  Gastrointestinal: Negative for  blood in stool and melena.  Genitourinary: Negative for hematuria.  Psychiatric/Behavioral:       Less anxious  All other systems reviewed and are negative.    Past Medical History  Diagnosis Date  . Migraines   . Allergy   . Palpitations 11/29/2014    Holter monitor: Max/min heart rate 52-151 with average 82. Some sinus bradycardia and some sinus tachycardia. Rare PVCs; occasional PACs with 7 runs of roughly 3 beats. No arrhythmias.    Past Surgical History  Procedure Laterality Date  . Cesarean section    . Wisdom tooth extraction    . Exercise tolerance test  12/14/2014    exercise for 9 minutes. No symptoms. No arrhythmias. No EKG changes. Low-risk   Prior to Admission medications   Medication Sig Start Date End Date Taking? Authorizing Provider  ALPRAZolam (XANAX) 0.25 MG tablet Take 1 tablet (0.25 mg total) by mouth 2 (two) times daily as needed for anxiety. 01/31/15   Pleas Koch, NP  aspirin EC 81 MG tablet Take 81 mg by mouth daily.    Historical Provider, MD  cetirizine (ZYRTEC) 10 MG tablet Take 10 mg by mouth as needed for allergies.    Historical Provider, MD  Magnesium 300 MG CAPS Take 300 mg by mouth every other day.    Historical Provider, MD  metoprolol tartrate (LOPRESSOR) 25 MG tablet TAKE HALF A TABLET IN MORNING  AND ONE WHOLE TABLET IN EVENING 01/29/15   Imogene Burn, PA-C  rizatriptan (MAXALT) 10 MG  tablet Take 10 mg by mouth as needed for migraine. May repeat in 2 hours if needed    Historical Provider, MD   Allergies  Allergen Reactions  . Meperidine Hcl     REACTION: increased BP \T\ Pulse  . Sulfonamide Derivatives     REACTION: rash     Social History   Social History  . Marital Status: Married    Spouse Name: N/A  . Number of Children: N/A  . Years of Education: N/A   Social History Main Topics  . Smoking status: Never Smoker   . Smokeless tobacco: Never Used  . Alcohol Use: No  . Drug Use: No  . Sexual Activity: Yes   Other  Topics Concern  . None   Social History Narrative   Family History  Problem Relation Age of Onset  . Hypertension Mother   . Cancer Mother   . Hyperlipidemia Father   . Arthritis Father   . Cancer Maternal Grandmother   . Cancer Paternal Grandmother   . Heart disease Paternal Grandfather      Wt Readings from Last 3 Encounters:  03/14/15 178 lb 4 oz (80.854 kg)  01/31/15 177 lb 1.9 oz (80.341 kg)  01/29/15 177 lb 6.4 oz (80.468 kg)    PHYSICAL EXAM BP 106/74 mmHg  Pulse 81  Ht 5' 5"  (1.651 m)  Wt 178 lb 4 oz (80.854 kg)  BMI 29.66 kg/m2 Neck: supple, no LAD her JVD. No carotid bruit. Lungs: clear to auscultation bilaterally, normal percussion bilaterally and Nonlabored, good air movement Heart: RRR, normal S1 and S2. Intermittent ectopy. No M./R./G. Nondisplaced PMI. Abdomen: soft, non-tender; bowel sounds normal; no masses, no organomegaly Extremities: extremities normal, atraumatic, no cyanosis or edema Pulses: 2+ and symmetric Skin: Skin color, texture, turgor normal. No rashes or lesions Neurologic: Alert and oriented X 3, normal strength and tone. Normal symmetric reflexes. Normal coordination and gait; Cranial nerves: normal    Adult ECG Report  Not Checked   Other studies Reviewed: Additional studies/ records that were reviewed today include:  Recent Labs:   Lab Results  Component Value Date   TSH 1.02 10/23/2014    ASSESSMENT / PLAN: Problem List Items Addressed This Visit    Situational anxiety (Chronic)    She clearly has is provoked by starting to feel palpitations. Hopefully by reducing her MSG intake, these will be reduced and her overall anxiety will improve.      Palpitations - Primary (Chronic)    Significant improvement after reducing her MSG intake. She is going to try to make her dietary adjustment to avoid MSG. Hopefully after month or so her palpitations will calm down we can start to wean off the metoprolol. She will do this by reducing  the evening dose back to 12.5, and after 6 days just take the evening dose without the morning dose and then stop. At that point she can use metoprolol when necessary.         Current medicines are reviewed at length with the patient today. (+/- concerns) She is hoping to sto BB. The following changes have been made:  Medication Instructions:  Your physician has recommended you make the following change in your medication:  WEAN metoprolol beginning in November - start off by reducing evening dose to 12.5 mg. After 6 days reduced to simply the evening dose without a morning dose. Then after 6 days stop the final evening dose.. Once you are completely off the medication, you may then  take as needed.  Studies Ordered:   No orders of the defined types were placed in this encounter.    ROV 3-4 Months   HARDING, Leonie Green, M.D., M.S. Interventional Cardiologist   Pager # [phone]

## 2015-03-14 ENCOUNTER — Encounter: Payer: Self-pay | Admitting: Cardiology

## 2015-03-14 ENCOUNTER — Ambulatory Visit (INDEPENDENT_AMBULATORY_CARE_PROVIDER_SITE_OTHER): Payer: BLUE CROSS/BLUE SHIELD | Admitting: Cardiology

## 2015-03-14 VITALS — BP 106/74 | HR 81 | Ht 65.0 in | Wt 178.2 lb

## 2015-03-14 DIAGNOSIS — F418 Other specified anxiety disorders: Secondary | ICD-10-CM

## 2015-03-14 DIAGNOSIS — R002 Palpitations: Secondary | ICD-10-CM | POA: Diagnosis not present

## 2015-03-14 NOTE — Patient Instructions (Addendum)
Medication Instructions:  Your physician has recommended you make the following change in your medication:  WEAN metoprolol beginning in November. Once you are completely off the medication, you may then take as needed.   Labwork: none  Testing/Procedures: none  Follow-Up: Your physician wants you to follow-up in: 3-4 months with Dr. Ellyn Hack.  You will receive a reminder letter in the mail two months in advance. If you don't receive a letter, please call our office to schedule the follow-up appointment.   Any Other Special Instructions Will Be Listed Below (If Applicable).

## 2015-03-15 ENCOUNTER — Encounter: Payer: Self-pay | Admitting: Cardiology

## 2015-03-15 NOTE — Assessment & Plan Note (Signed)
Significant improvement after reducing her MSG intake. She is going to try to make her dietary adjustment to avoid MSG. Hopefully after month or so her palpitations will calm down we can start to wean off the metoprolol. She will do this by reducing the evening dose back to 12.5, and after 6 days just take the evening dose without the morning dose and then stop. At that point she can use metoprolol when necessary.

## 2015-03-15 NOTE — Assessment & Plan Note (Signed)
She clearly has is provoked by starting to feel palpitations. Hopefully by reducing her MSG intake, these will be reduced and her overall anxiety will improve.

## 2015-03-22 ENCOUNTER — Ambulatory Visit (INDEPENDENT_AMBULATORY_CARE_PROVIDER_SITE_OTHER): Payer: BLUE CROSS/BLUE SHIELD | Admitting: Psychology

## 2015-03-22 DIAGNOSIS — F4323 Adjustment disorder with mixed anxiety and depressed mood: Secondary | ICD-10-CM | POA: Diagnosis not present

## 2015-04-03 ENCOUNTER — Ambulatory Visit: Payer: BLUE CROSS/BLUE SHIELD | Admitting: Internal Medicine

## 2015-04-19 ENCOUNTER — Ambulatory Visit (INDEPENDENT_AMBULATORY_CARE_PROVIDER_SITE_OTHER): Payer: BLUE CROSS/BLUE SHIELD | Admitting: Psychology

## 2015-04-19 DIAGNOSIS — F4323 Adjustment disorder with mixed anxiety and depressed mood: Secondary | ICD-10-CM

## 2015-05-10 ENCOUNTER — Telehealth: Payer: Self-pay | Admitting: *Deleted

## 2015-05-10 NOTE — Telephone Encounter (Signed)
Lm on pts vm requesting a call back if wanting to schedule a flu shot

## 2015-06-27 ENCOUNTER — Ambulatory Visit (INDEPENDENT_AMBULATORY_CARE_PROVIDER_SITE_OTHER): Payer: BLUE CROSS/BLUE SHIELD | Admitting: Family Medicine

## 2015-06-27 ENCOUNTER — Encounter: Payer: Self-pay | Admitting: Family Medicine

## 2015-06-27 VITALS — BP 114/62 | HR 71 | Temp 98.1°F | Wt 180.8 lb

## 2015-06-27 DIAGNOSIS — R002 Palpitations: Secondary | ICD-10-CM | POA: Diagnosis not present

## 2015-06-27 DIAGNOSIS — R1013 Epigastric pain: Secondary | ICD-10-CM | POA: Diagnosis not present

## 2015-06-27 LAB — COMPREHENSIVE METABOLIC PANEL
ALT: 13 U/L (ref 0–35)
AST: 15 U/L (ref 0–37)
Albumin: 4.1 g/dL (ref 3.5–5.2)
Alkaline Phosphatase: 71 U/L (ref 39–117)
BILIRUBIN TOTAL: 0.2 mg/dL (ref 0.2–1.2)
BUN: 12 mg/dL (ref 6–23)
CALCIUM: 8.9 mg/dL (ref 8.4–10.5)
CO2: 29 meq/L (ref 19–32)
CREATININE: 0.75 mg/dL (ref 0.40–1.20)
Chloride: 105 mEq/L (ref 96–112)
GFR: 91.07 mL/min (ref >60.00)
GLUCOSE: 69 mg/dL — AB (ref 70–99)
Potassium: 4.1 mEq/L (ref 3.5–5.1)
SODIUM: 139 meq/L (ref 135–145)
Total Protein: 7.4 g/dL (ref 6.0–8.3)

## 2015-06-27 LAB — H. PYLORI ANTIBODY, IGG: H Pylori IgG: NEGATIVE

## 2015-06-27 LAB — LIPASE: Lipase: 16 U/L (ref 11.0–59.0)

## 2015-06-27 NOTE — Progress Notes (Signed)
Pre visit review using our clinic review tool, if applicable. No additional management support is needed unless otherwise documented below in the visit note. 

## 2015-06-27 NOTE — Patient Instructions (Signed)
Great to see you. Please stop by to see Rosaria Ferries on your way out.  I will call you with your lab results.

## 2015-06-27 NOTE — Assessment & Plan Note (Signed)
Deteriorated. ? Link to her palpitations as well- ?vagal gut innervation. Labs today- RUQ [place] as well. The patient indicates understanding of these issues and agrees with the plan.  Orders Placed This Encounter  Procedures  . US Abdomen Limited RUQ  . Lipase  . H. pylori antibody, IgG  . Comprehensive metabolic panel

## 2015-06-27 NOTE — Progress Notes (Signed)
Subjective:   Patient ID: Jamie [name], female    DOB: 20-Oct-1975, 40 y.o.   MRN: [id number]  Jamie [name] is a pleasant 40 y.o. year old female who presents to clinic today with Abdominal Pain  on 06/27/2015  HPI:  Epigastric pain/bloating- chronic intermittent issue, years ago told she has IBS. Does take probiotics and it is not helping much.  Also has heart palpitations- sees cardiology, on beta blocker.  Pain is mostly at night- comes in spasms. No nausea or vomiting. Pain sometimes radiates to RUQ.  Does seem to be worsened by heavier foods.  Has tried Tums.  Current Outpatient Prescriptions on File Prior to Visit  Medication Sig Dispense Refill  . cetirizine (ZYRTEC) 10 MG tablet Take 10 mg by mouth as needed for allergies.    . Magnesium 300 MG CAPS Take 300 mg by mouth every other day.    . metoprolol tartrate (LOPRESSOR) 25 MG tablet TAKE HALF A TABLET IN MORNING  AND ONE WHOLE TABLET IN EVENING (Patient taking differently: Take 25 mg by mouth daily. ) 180 tablet 3  . rizatriptan (MAXALT) 10 MG tablet Take 10 mg by mouth as needed for migraine. May repeat in 2 hours if needed     No current facility-administered medications on file prior to visit.    Allergies  Allergen Reactions  . Meperidine Hcl     REACTION: increased BP \T\ Pulse  . Sulfonamide Derivatives     REACTION: rash    Past Medical History  Diagnosis Date  . Migraines   . Allergy   . Palpitations 11/29/2014    Holter monitor: Max/min heart rate 52-151 with average 82. Some sinus bradycardia and some sinus tachycardia. Rare PVCs; occasional PACs with 7 runs of roughly 3 beats. No arrhythmias.    Past Surgical History  Procedure Laterality Date  . Cesarean section    . Wisdom tooth extraction    . Exercise tolerance test  12/14/2014    exercise for 9 minutes. No symptoms. No arrhythmias. No EKG changes. Low-risk    Family History  Problem Relation Age of Onset  . Hypertension Mother   .  Cancer Mother   . Hyperlipidemia Father   . Arthritis Father   . Cancer Maternal Grandmother   . Cancer Paternal Grandmother   . Heart disease Paternal Grandfather     Social History   Social History  . Marital Status: Married    Spouse Name: N/A  . Number of Children: N/A  . Years of Education: N/A   Occupational History  . Not on file.   Social History Main Topics  . Smoking status: Never Smoker   . Smokeless tobacco: Never Used  . Alcohol Use: No  . Drug Use: No  . Sexual Activity: Yes   Other Topics Concern  . Not on file   Social History Narrative   The PMH, PSH, Social History, Family History, Medications, and allergies have been reviewed in [facility], and have been updated if relevant.   Review of Systems  Constitutional: Positive for fatigue. Negative for fever.  Gastrointestinal: Positive for abdominal pain and abdominal distention. Negative for nausea, vomiting, diarrhea, constipation, blood in stool, anal bleeding and rectal pain.  Genitourinary: Negative.   Musculoskeletal: Negative.   Skin: Negative.   All other systems reviewed and are negative.      Objective:    BP 114/62 mmHg  Pulse 71  Temp(Src) 98.1 F (36.7 C) (Oral)  Wt 180 lb  12 oz (81.988 kg)  SpO2 98%  LMP 05/22/2015   Physical Exam  Constitutional: She is oriented to person, place, and time. She appears well-developed and well-nourished. No distress.  HENT:  Head: Normocephalic.  Eyes: Conjunctivae are normal.  Cardiovascular: Normal rate.   Pulmonary/Chest: Effort normal.  Abdominal: Soft. Bowel sounds are normal. She exhibits no distension and no mass. There is tenderness. There is no rebound and no guarding.  Musculoskeletal: Normal range of motion.  Neurological: She is alert and oriented to person, place, and time. No cranial nerve deficit.  Skin: Skin is warm and dry. She is not diaphoretic.  Psychiatric: She has a normal mood and affect. Her behavior is normal. Judgment and  thought content normal.  Nursing note and vitals reviewed.         Assessment & Plan:   Palpitations  Abdominal pain, epigastric No Follow-up on file.

## 2015-06-28 ENCOUNTER — Ambulatory Visit
Admission: RE | Admit: 2015-06-28 | Discharge: 2015-06-28 | Disposition: A | Discharge status: Home / Self Care | Payer: BLUE CROSS/BLUE SHIELD | Source: Ambulatory Visit | Attending: Family Medicine | Admitting: Family Medicine

## 2015-06-28 ENCOUNTER — Other Ambulatory Visit: Payer: Self-pay | Admitting: Family Medicine

## 2015-06-28 DIAGNOSIS — R1013 Epigastric pain: Secondary | ICD-10-CM

## 2015-07-02 ENCOUNTER — Encounter: Payer: Self-pay | Admitting: Physician Assistant

## 2015-07-11 ENCOUNTER — Ambulatory Visit: Payer: Self-pay | Admitting: Cardiology

## 2015-07-12 ENCOUNTER — Ambulatory Visit: Payer: BLUE CROSS/BLUE SHIELD | Admitting: Internal Medicine

## 2015-07-20 ENCOUNTER — Encounter: Payer: Self-pay | Admitting: Internal Medicine

## 2015-07-20 ENCOUNTER — Telehealth: Payer: Self-pay | Admitting: *Deleted

## 2015-07-20 ENCOUNTER — Telehealth: Payer: Self-pay | Admitting: Internal Medicine

## 2015-07-20 ENCOUNTER — Ambulatory Visit (INDEPENDENT_AMBULATORY_CARE_PROVIDER_SITE_OTHER): Payer: BLUE CROSS/BLUE SHIELD | Admitting: Internal Medicine

## 2015-07-20 VITALS — BP 120/80 | HR 76 | Ht 65.0 in | Wt 183.4 lb

## 2015-07-20 DIAGNOSIS — R002 Palpitations: Secondary | ICD-10-CM | POA: Diagnosis not present

## 2015-07-20 MED ORDER — PROPRANOLOL HCL ER 60 MG PO CP24: 60.0000 mg | ORAL_CAPSULE | Freq: Every day | ORAL | Status: DC

## 2015-07-20 MED ORDER — METOPROLOL SUCCINATE ER 50 MG PO TB24: ORAL_TABLET | ORAL | Status: DC

## 2015-07-20 MED ORDER — DILTIAZEM HCL 60 MG PO TABS: 60.0000 mg | ORAL_TABLET | Freq: Every day | ORAL | Status: DC

## 2015-07-20 MED ORDER — METOPROLOL SUCCINATE ER 25 MG PO TB24: ORAL_TABLET | ORAL | Status: DC

## 2015-07-20 MED ORDER — ATENOLOL 25 MG PO TABS: 25.0000 mg | ORAL_TABLET | Freq: Every day | ORAL | Status: DC

## 2015-07-20 NOTE — Telephone Encounter (Signed)
[facility] [place] and clarified that dose is Toprol 25 mg daily.  Will send prescription

## 2015-07-20 NOTE — Telephone Encounter (Signed)
New Message:  Lonn Georgia the pharmacist is calling in to clarify the directions and dosage for the pt's Metoprolol Succinate prescription. Please f/u with her

## 2015-07-20 NOTE — Telephone Encounter (Signed)
See other phone note dated 07/20/15

## 2015-07-20 NOTE — Telephone Encounter (Signed)
[facility] for clarification on metoprolol rx. Should it be metoprolol succinate 47m tablet, take one daily?

## 2015-07-20 NOTE — Patient Instructions (Signed)
Medication Instructions: You have been given prescriptions for 4 different medications to try. You may take them in any order, but DO NOT take more than one at a time. Try to give at least 2 weeks on a medication before stopping it if you can:  1) Atenolol 25 mg one tablet by mouth once daily 2) Toprol (Metoprolol succinate) 25 mg one tablet by mouth once daily 3) Inderal LA (propranolol) 60 mg one tablet by mouth once daily 4) Cardiazem (diltiazem) 160 mg one tablet by mouth once daily  Labwork: - none  Procedures/Testing: - none  Follow-Up: - Your physician recommends that you schedule a follow-up appointment in: 2-3 months  Any Additional Special Instructions Will Be Listed Below (If Applicable).     If you need a refill on your cardiac medications before your next appointment, please call your pharmacy.

## 2015-07-20 NOTE — Progress Notes (Signed)
ELECTROPHYSIOLOGY CONSULT NOTE  Patient ID: Jamie [name], MRN: [id number], DOB/AGE: 08/01/1975 40 y.o. Admit date: (Not on file) Date of Consult: 07/20/2015  Primary Physician: Arnette Norris, MD Primary Cardiologist: [facility] Consulting Physician [facility]  Chief Complaint: palpitations*   HPI Jamie [name] is a 40 y.o. female  Seen for palpitations.  The started rather abruptly May 2016.At that time she was concurrently ill with diarrhea nausea myalgias. No symptoms gradually abated but the palpitations persisted.  It was initially postulated that it was secondary to MSG. Not withstanding the elimination of MSG from her diet (as much as she could prove) her symptoms persisted. It is noteworthy that they gradually abated and then worsened again.  She has modified her beta blockers and there is originally some improvement with the drugs but the recurrence of symptoms over the last month or 2 there has been no effect of the drug.  She has chronic GI symptoms of nausea and bloating.she is anticipating an evaluation over the next few weeks. The symptoms antedated the palpitations. However, when they are aggravated, the palpitations also seemed to get worse.  She notes no exercise tolerance impairment   Holter monitoring 6/16 had demonstrated PACs and PVCs. Echocardiogram had demonstrated normal LV function and no valvular lesions.      Past Medical History  Diagnosis Date  . Migraines   . Allergy   . Palpitations 11/29/2014    Holter monitor: Max/min heart rate 52-151 with average 82. Some sinus bradycardia and some sinus tachycardia. Rare PVCs; occasional PACs with 7 runs of roughly 3 beats. No arrhythmias.      Surgical History:  Past Surgical History  Procedure Laterality Date  . Cesarean section    . Wisdom tooth extraction    . Exercise tolerance test  12/14/2014    exercise for 9 minutes. No symptoms. No arrhythmias. No EKG changes. Low-risk     Home Meds: Prior to Admission  medications   Medication Sig Start Date End Date Taking? Authorizing Provider  cetirizine (ZYRTEC) 10 MG tablet Take 10 mg by mouth as needed for allergies.    Historical Provider, MD  Magnesium 300 MG CAPS Take 300 mg by mouth every other day.    Historical Provider, MD  metoprolol tartrate (LOPRESSOR) 25 MG tablet TAKE HALF A TABLET IN MORNING  AND ONE WHOLE TABLET IN EVENING Patient taking differently: Take 25 mg by mouth daily.  01/29/15   Imogene Burn, PA-C  rizatriptan (MAXALT) 10 MG tablet Take 10 mg by mouth as needed for migraine. May repeat in 2 hours if needed    Historical Provider, MD    Allergies:  Allergies  Allergen Reactions  . Meperidine Hcl     REACTION: increased BP \T\ Pulse  . Sulfonamide Derivatives     REACTION: rash    Social History   Social History  . Marital Status: Married    Spouse Name: N/A  . Number of Children: N/A  . Years of Education: N/A   Occupational History  . Not on file.   Social History Main Topics  . Smoking status: Never Smoker   . Smokeless tobacco: Never Used  . Alcohol Use: No  . Drug Use: No  . Sexual Activity: Yes   Other Topics Concern  . Not on file   Social History Narrative     Family History  Problem Relation Age of Onset  . Hypertension Mother   . Cancer Mother   .  Hyperlipidemia Father   . Arthritis Father   . Cancer Maternal Grandmother   . Cancer Paternal Grandmother   . Heart disease Paternal Grandfather      ROS:  Please see the history of present illness.     All other systems reviewed and negative.    Physical Exam: Blood pressure 120/80, pulse 76, height 5' 5"  (1.651 m), weight 183 lb 6.4 oz (83.19 kg), last menstrual period 05/22/2015. General: Well developed, well nourished female in no acute distress. Head: Normocephalic, atraumatic, sclera non-icteric, no xanthomas, nares are without discharge. EENT: normal  Lymph Nodes:  none Neck: Negative for carotid bruits. JVD not  elevated. Back:without scoliosis kyphosis  Lungs: Clear bilaterally to auscultation without wheezes, rales, or rhonchi. Breathing is unlabored. Heart: RRR with S1 S2. No   murmur . No rubs, or gallops appreciated. Abdomen: Soft, non-tender, non-distended with normoactive bowel sounds. No hepatomegaly. No rebound/guarding. No obvious abdominal masses. Msk:  Strength and tone appear normal for age. Extremities: No clubbing or cyanosis. No* * edema.  Distal pedal pulses are 2+ and equal bilaterally. Skin: Warm and Dry Neuro: Alert and oriented X 3. CN III-XII intact Grossly normal sensory and motor function . Psych:  Responds to questions appropriately with a normal affect.      Labs: Cardiac Enzymes No results for input(s): CKTOTAL, CKMB, TROPONINI in the last 72 hours. CBC Lab Results  Component Value Date   WBC 7.8 11/02/2014   HGB 12.1 11/02/2014   HCT 36.4 11/02/2014   MCV 91.2 11/02/2014   PLT 255 11/02/2014   PROTIME: No results for input(s): LABPROT, INR in the last 72 hours. Chemistry No results for input(s): NA, K, CL, CO2, BUN, CREATININE, CALCIUM, PROT, BILITOT, ALKPHOS, ALT, AST, GLUCOSE in the last 168 hours.  Invalid input(s): LABALBU Lipids No results found for: CHOL, HDL, LDLCALC, TRIG BNP No results found for: PROBNP Thyroid Function Tests: No results for input(s): TSH, T4TOTAL, T3FREE, THYROIDAB in the last 72 hours.  Invalid input(s): FREET3 Miscellaneous No results found for: DDIMER  Radiology/Studies:  US Abdomen Limited Ruq  06/28/2015  CLINICAL DATA:  Right upper quadrant discomfort, nausea EXAM: US ABDOMEN LIMITED - RIGHT UPPER QUADRANT COMPARISON:  None. FINDINGS: Gallbladder: No gallstones or wall thickening visualized. No sonographic Murphy sign noted by sonographer. Common bile duct: Diameter: 3.3 mm Liver: No focal lesion identified. Within normal limits in parenchymal echogenicity. IMPRESSION: Normal right upper quadrant ultrasound.  Electronically Signed   By: Kathreen Devoid   On: 06/28/2015 09:38    EKG:  Sinus rhythm at 76 Intervals 14/08/37 Axis LXII   Assessment and Plan:  Palpitations-PACs/PVCs/nonsustained atrial tachycardia  Chronic GI symptoms of nausea and bloating    The patient's symptoms developed concurrent with a viral illness. It is certainly possible that there is a low grade case of myocarditis that ensued giving rise to the ectopy. It is Reassuring that echocardiogram done 4 months after the onset of her symptoms still demonstrated normal LV function.  The patient is largely concerned as to the  Cause.  I'm not sure that I have anything more apart from the possibility of the aforementioned hypothesis.  In that context however, I think it is likely that the symptoms might well of 8 over time which addresses another of her concerns which is long-term medications.  Given that the current metoprolol is not effective, I've given her a prescription for atenolol 25, metoprolol succinate 25, Inderal LA 60 and diltiazem short-acting 60 to take as  needed and random order to see if we can find one that works and she tolerates well.  We will plan to see her in 3 or 4 months.    Virl Axe

## 2015-07-25 ENCOUNTER — Other Ambulatory Visit (INDEPENDENT_AMBULATORY_CARE_PROVIDER_SITE_OTHER): Payer: BLUE CROSS/BLUE SHIELD

## 2015-07-25 ENCOUNTER — Encounter: Payer: Self-pay | Admitting: Physician Assistant

## 2015-07-25 ENCOUNTER — Ambulatory Visit (INDEPENDENT_AMBULATORY_CARE_PROVIDER_SITE_OTHER): Payer: BLUE CROSS/BLUE SHIELD | Admitting: Physician Assistant

## 2015-07-25 ENCOUNTER — Telehealth: Payer: Self-pay | Admitting: Physician Assistant

## 2015-07-25 ENCOUNTER — Other Ambulatory Visit: Payer: Self-pay | Admitting: Emergency Medicine

## 2015-07-25 VITALS — BP 106/76 | HR 76 | Ht 65.0 in | Wt 179.0 lb

## 2015-07-25 DIAGNOSIS — K589 Irritable bowel syndrome without diarrhea: Secondary | ICD-10-CM

## 2015-07-25 DIAGNOSIS — K219 Gastro-esophageal reflux disease without esophagitis: Secondary | ICD-10-CM | POA: Diagnosis not present

## 2015-07-25 LAB — HEPATIC FUNCTION PANEL
ALT: 13 U/L (ref 0–35)
AST: 16 U/L (ref 0–37)
Albumin: 4.4 g/dL (ref 3.5–5.2)
Alkaline Phosphatase: 67 U/L (ref 39–117)
BILIRUBIN TOTAL: 0.4 mg/dL (ref 0.2–1.2)
Bilirubin, Direct: 0 mg/dL (ref 0.0–0.3)
TOTAL PROTEIN: 7.8 g/dL (ref 6.0–8.3)

## 2015-07-25 LAB — CBC WITH DIFFERENTIAL/PLATELET
Basophils Absolute: 0 10*3/uL (ref 0.0–0.1)
Basophils Relative: 0.7 % (ref 0.0–3.0)
EOS PCT: 2.6 % (ref 0.0–5.0)
Eosinophils Absolute: 0.2 10*3/uL (ref 0.0–0.7)
HCT: 36 % (ref 36.0–46.0)
HEMOGLOBIN: 12.3 g/dL (ref 12.0–15.0)
LYMPHS PCT: 27.3 % (ref 12.0–46.0)
Lymphs Abs: 1.6 10*3/uL (ref 0.7–4.0)
MCHC: 34.3 g/dL (ref 30.0–36.0)
MCV: 89.8 fl (ref 78.0–100.0)
Monocytes Absolute: 0.5 10*3/uL (ref 0.1–1.0)
Monocytes Relative: 8.1 % (ref 3.0–12.0)
NEUTROS ABS: 3.5 10*3/uL (ref 1.4–7.7)
Neutrophils Relative %: 61.3 % (ref 43.0–77.0)
Platelets: 240 10*3/uL (ref 150.0–400.0)
RBC: 4 Mil/uL (ref 3.87–5.11)
RDW: 12.9 % (ref 11.5–15.5)
WBC: 5.8 10*3/uL (ref 4.0–10.5)

## 2015-07-25 LAB — IGA: IgA: 197 mg/dL (ref 68–378)

## 2015-07-25 MED ORDER — DICYCLOMINE HCL 10 MG PO CAPS: 10.0000 mg | ORAL_CAPSULE | Freq: Three times a day (TID) | ORAL | Status: DC | PRN

## 2015-07-25 MED ORDER — HYOSCYAMINE SULFATE 0.125 MG SL SUBL: 0.1250 mg | SUBLINGUAL_TABLET | Freq: Four times a day (QID) | SUBLINGUAL | Status: DC | PRN

## 2015-07-25 MED ORDER — PANTOPRAZOLE SODIUM 40 MG PO TBEC: 40.0000 mg | DELAYED_RELEASE_TABLET | ORAL | Status: DC

## 2015-07-25 NOTE — Telephone Encounter (Signed)
Per Cecille Rubin we will try Bentyl one tab 8 hrs for cramping prn.

## 2015-07-25 NOTE — Patient Instructions (Signed)
Your physician has requested that you go to the basement for lab work before leaving today.  You have been scheduled for an endoscopy. Please follow written instructions given to you at your visit today. If you use inhalers (even only as needed), please bring them with you on the day of your procedure. Your physician has requested that you go to www.startemmi.com and enter the access code given to you at your visit today. This web site gives a general overview about your procedure. However, you should still follow specific instructions given to you by our office regarding your preparation for the procedure.  We have sent the following medications to your pharmacy for you to pick up at your convenience: Pantoprazole 40 mg every morning 30 mins prior to breakfast  Levsin 0.125 mg every 6 hrs as needed for cramps.

## 2015-07-26 ENCOUNTER — Encounter: Payer: Self-pay | Admitting: Physician Assistant

## 2015-07-26 LAB — TISSUE TRANSGLUTAMINASE, IGA: Tissue Transglutaminase Ab, IgA: 1 U/mL (ref <4)

## 2015-07-26 NOTE — Progress Notes (Signed)
Patient ID: [name], female   DOB: 22-Dec-1975, 40 y.o.   MRN: [id number]    HPI:  Jamie [name] is a 40 y.o.   female  referred by Jamie Passy, MD  for evaluation of abdominal pain.  Jamie [name] states she has "always had stomach issues all of my life ". She states she was diagnosed with IBS in her 61s by her primary care provider. Last May she had an episode with diffuse crampy abdominal pain associated with weakness and headaches and palpitations. She was told she was having a reaction to MSG. She had a cardiology workup and all was nonrevealing. She reports that she often has upper abdominal pain which is sometimes in the left upper quadrant and sometimes in the right upper quadrant. She is always burping. She feels full after 2-3 bites of food. She has frequent heartburn on a daily basis and uses Tums every night. She has not had melena or bright red blood per rectum. She has no dysphagia. Her abdominal pain usually occurs at night when she lies down. She will sometimes wake up with burning in the epigastric area and have to take a Tums. She denies use of nonsteroidal anti-inflammatories and denies use of alcohol. She denies dysphagia.    She also reports that she often eats, developed lower abdominal cramping, and has to have a bowel movement after which her cramping is relieved. She has a bowel movement on a daily basis sometimes formed and sometimes loose. She reports that she has a paternal uncle who was recently diagnosed with celiac disease and she questions if she may have celiac disease. She is unaware of a family history of colon cancer, colon polyps or inflammatory bowel disease. She is unaware of a family history of esophageal or pancreatic cancer. She states she has a brother who was recently diagnosed with a brain tumor and her grandparents had skin cancer.   Past Medical History  Diagnosis Date  . Migraines   . Allergy   . Palpitations 11/29/2014    Holter monitor: Max/min heart  rate 52-151 with average 82. Some sinus bradycardia and some sinus tachycardia. Rare PVCs; occasional PACs with 7 runs of roughly 3 beats. No arrhythmias.    Past Surgical History  Procedure Laterality Date  . Cesarean section      x3  . Wisdom tooth extraction    . Exercise tolerance test  12/14/2014    exercise for 9 minutes. No symptoms. No arrhythmias. No EKG changes. Low-risk   Family History  Problem Relation Age of Onset  . Adopted: Yes  . Hypertension Mother   . Skin cancer Mother   . Hyperlipidemia Father   . Arthritis Father   . Cancer Maternal Grandmother   . Rectal cancer Paternal Grandmother   . Heart disease Paternal Grandfather   . Skin cancer Paternal Grandmother   . Skin cancer Father   . Brain cancer Brother    Social History  Substance Use Topics  . Smoking status: Never Smoker   . Smokeless tobacco: Never Used  . Alcohol Use: No   Current Outpatient Prescriptions  Medication Sig Dispense Refill  . atenolol (TENORMIN) 25 MG tablet Take 1 tablet (25 mg total) by mouth at bedtime. 30 tablet 2  . cetirizine (ZYRTEC) 10 MG tablet Take 10 mg by mouth as needed for allergies.    Marland Kitchen diltiazem (CARDIZEM) 60 MG tablet Take 1 tablet (60 mg total) by mouth at bedtime. 30 tablet 2  .  Magnesium 300 MG CAPS Take 300 mg by mouth every other day.    . metoprolol succinate (TOPROL-XL) 25 MG 24 hr tablet Take one tablet (25 mg)  by mouth daily 30 tablet 11  . propranolol ER (INDERAL LA) 60 MG 24 hr capsule Take 1 capsule (60 mg total) by mouth at bedtime. 30 capsule 2  . rizatriptan (MAXALT) 10 MG tablet Take 10 mg by mouth as needed for migraine. May repeat in 2 hours if needed    . dicyclomine (BENTYL) 10 MG capsule Take 1 capsule (10 mg total) by mouth every 8 (eight) hours as needed for spasms. 90 capsule 0  . hyoscyamine (LEVSIN SL) 0.125 MG SL tablet Place 1 tablet (0.125 mg total) under the tongue every 6 (six) hours as needed for cramping. 120 tablet 3  . pantoprazole  (PROTONIX) 40 MG tablet Take 1 tablet (40 mg total) by mouth every morning. 30 tablet 6   No current facility-administered medications for this visit.   Allergies  Allergen Reactions  . Meperidine Hcl     REACTION: increased BP \T\ Pulse  . Sulfonamide Derivatives     REACTION: rash     Review of Systems:  per history of present illness otherwise negative.  Studies: US Abdomen Limited Ruq  Jul 03, 2015  CLINICAL DATA:  Right upper quadrant discomfort, nausea EXAM: US ABDOMEN LIMITED - RIGHT UPPER QUADRANT COMPARISON:  None. FINDINGS: Gallbladder: No gallstones or wall thickening visualized. No sonographic Murphy sign noted by sonographer. Common bile duct: Diameter: 3.3 mm Liver: No focal lesion identified. Within normal limits in parenchymal echogenicity. IMPRESSION: Normal right upper quadrant ultrasound. Electronically Signed   By: Kathreen Devoid   On: 07-03-15 09:38     Physical Exam: BP 106/76 mmHg  Pulse 76  Ht 5' 5"  (1.651 m)  Wt 179 lb (81.194 kg)  BMI 29.79 kg/m2  LMP 05/22/2015 Constitutional: Pleasant,well-developed, female in no acute distress. HEENT: Normocephalic and atraumatic. Conjunctivae are normal. No scleral icterus. Neck supple.  Cardiovascular: Normal rate, regular rhythm.  Pulmonary/chest: Effort normal and breath sounds normal. No wheezing, rales or rhonchi. Abdominal: Soft, nondistended, nontender. Bowel sounds active throughout. There are no masses palpable. No hepatomegaly. Extremities: no edema Lymphadenopathy: No cervical adenopathy noted. Neurological: Alert and oriented to person place and time. Skin: Skin is warm and dry. No rashes noted. Psychiatric: Normal mood and affect. Behavior is normal.  ASSESSMENT AND PLAN:  40 year old female with a long-standing history of heartburn and epigastric pain referred for evaluation. An antireflux regimen has been reviewed , and she will be given a trial of pantoprazole 40 mg 1 by mouth every morning 30  minutes prior to breakfast. A hepatic function panel will be obtained. She will be scheduled for an EGD to evaluate for esophagitis, gastritis, ulcer, etc.The risks, benefits, and alternatives to endoscopy with possible biopsy and possible dilation were discussed with the patient and they consent to proceed.   Due to the patient's schedule and her insurance changing in the next few weeks , her procedure has been scheduled with Dr. Loletha Carrow  due to the availability of Endo spots per patient request.   Her erratic bowel movements are likely functional in nature , but an IgA and TTG will be obtained to evaluate for possible celiac. If abnormal, she will be considered for small bowel biopsy at the time of her EGD. A CBC will also be obtained. In the meantime she will adhere to a high-fiber, low-fat diet. She will be given  a trial of Levsin sublingual, 0.125 mg 1 sublingual every 6 hours when necessary cramps.    further recommendations will be made pending the findings of the above.    Jamaris Theard, Deloris Ping 07/26/2015, 4:59 PM  CC: Jamie Passy, MD

## 2015-07-30 ENCOUNTER — Encounter: Payer: Self-pay | Admitting: Physician Assistant

## 2015-07-30 ENCOUNTER — Encounter: Payer: Self-pay | Admitting: Family Medicine

## 2015-07-31 ENCOUNTER — Telehealth: Payer: Self-pay | Admitting: *Deleted

## 2015-07-31 NOTE — Progress Notes (Signed)
Thank you for sending this case to me. I have reviewed the entire note, and the outlined plan seems appropriate.

## 2015-07-31 NOTE — Telephone Encounter (Signed)
Patient's pharmacy calling about Dicyclomine. National shortage and [facility] not [facility] it. Please,advise.

## 2015-07-31 NOTE — Telephone Encounter (Signed)
New rx called to pharmacy.

## 2015-07-31 NOTE — Telephone Encounter (Signed)
Can she tried generic Levsin 0.125 mg 3 times a day when necessary?

## 2015-08-02 ENCOUNTER — Other Ambulatory Visit: Payer: Self-pay | Admitting: Emergency Medicine

## 2015-08-02 ENCOUNTER — Telehealth: Payer: Self-pay | Admitting: Physician Assistant

## 2015-08-02 MED ORDER — DICYCLOMINE HCL 20 MG PO TABS: ORAL_TABLET | ORAL | Status: DC

## 2015-08-02 NOTE — Telephone Encounter (Signed)
A user error has taken place.

## 2015-08-02 NOTE — Telephone Encounter (Signed)
Patient's insurance will not cover Levsin. Pharmacy has Dicyclomine 20 mg tablets and can write rx for patient to take 1/2 tablet every 8 hours prn spasm. Rx gvien for this.

## 2015-08-02 NOTE — Telephone Encounter (Signed)
Opened in error

## 2015-08-02 NOTE — Telephone Encounter (Signed)
Caller name: Vicente Males Relation to pt: Pharmacist Call back [phone] [facility]  Reason for call:  Refer to 2/21 phone note - Pharmacist is calling with an update about shortage.

## 2015-08-03 ENCOUNTER — Telehealth: Payer: Self-pay | Admitting: Family Medicine

## 2015-08-03 ENCOUNTER — Encounter: Payer: Self-pay | Admitting: Adult Health

## 2015-08-03 ENCOUNTER — Ambulatory Visit (INDEPENDENT_AMBULATORY_CARE_PROVIDER_SITE_OTHER): Payer: BLUE CROSS/BLUE SHIELD | Admitting: Adult Health

## 2015-08-03 VITALS — BP 130/90 | HR 87 | Temp 98.4°F | Wt 179.0 lb

## 2015-08-03 DIAGNOSIS — R1031 Right lower quadrant pain: Secondary | ICD-10-CM | POA: Diagnosis not present

## 2015-08-03 NOTE — Progress Notes (Signed)
   Subjective:    Patient ID: Jamie [name], female    DOB: [birth year], 40 y.o.   MRN: [id number]  HPI  40 year old female who presents to the office today for right lower quadrant pain that started at 4 am this morning. She woke up and had four bouts of diarrhea. At that time her pain was worse and was described as "cramping". Throughout the day her pain has subsided and she only has pain with palpation. She has had a ruptured ovarian cyst in the past, about 10 years ago. Her last period was 2 weeks ago.   She denies any nausea, vomiting, or fevers. She does not have any pain when she is not palpating her abdomen.   Review of Systems  Constitutional: Negative.   Respiratory: Negative.   Gastrointestinal: Positive for abdominal pain and diarrhea. Negative for nausea, vomiting and constipation.  Musculoskeletal: Negative.   Skin: Negative.   Psychiatric/Behavioral: Negative.   All other systems reviewed and are negative.      Objective:   Physical Exam  Constitutional: She is oriented to person, place, and time. She appears well-developed and well-nourished. No distress.  Cardiovascular: Normal rate, regular rhythm, normal heart sounds and intact distal pulses.  Exam reveals no gallop and no friction rub.   No murmur heard. Pulmonary/Chest: Effort normal and breath sounds normal. No respiratory distress. She has no wheezes. She has no rales. She exhibits no tenderness.  Abdominal: Soft. Bowel sounds are normal. She exhibits no distension, no abdominal bruit, no pulsatile midline mass and no mass. There is no hepatosplenomegaly, splenomegaly or hepatomegaly. There is tenderness in the right lower quadrant. There is no rigidity, no rebound, no guarding, no CVA tenderness, no tenderness at McBurney's point and negative Murphy's sign.    Musculoskeletal: Normal range of motion. She exhibits no edema.  Neurological: She is alert and oriented to person, place, and time.  Skin: Skin is warm  and dry. No rash noted. She is not diaphoretic. No erythema. No pallor.  Psychiatric: She has a normal mood and affect. Her behavior is normal. Judgment and thought content normal.  Nursing note and vitals reviewed.     Assessment & Plan:  1. Right lower quadrant abdominal pain Appendicitis vs ovarian cyst - Pain with palpation to the right lower quadrant. No rebound, no psoas sign, no obturator, no Rovings.  - I advised to go to the ER for imaging. She did not want to do this at this time. She advised if her pain became worse, she spiked a fever, or had vomiting, then she would go

## 2015-08-03 NOTE — Telephone Encounter (Signed)
Pt has appt 08/03/15 at 1:30 with Dorothyann Peng NP. Spoke with pt rt lower abd pain started about 4 AM today with diarrhea and abd pain; pain is consistent today. No fever and no N&V. Pt does have more pain when pushes on rt lower quad of abd.pt had ovarian cyst  10 years ago.  Pt does not want to go to UC or ED. Pt will keep appt today at 1:30 but if pt's pain worsen prior to appt, pt will go to ED for eval.

## 2015-08-03 NOTE — Telephone Encounter (Signed)
PLEASE NOTE: All timestamps contained within this report are represented as [place] Standard Time. CONFIDENTIALTY NOTICE: This fax transmission is intended only for the addressee. It contains information that is legally privileged, confidential or otherwise protected from use or disclosure. If you are not the intended recipient, you are strictly prohibited from reviewing, disclosing, copying using or disseminating any of this information or taking any action in reliance on or regarding this information. If you have received this fax in error, please notify us immediately by telephone so that we can arrange for its return to [place]. Phone: [phone], Toll-Free: [phone], Fax: [phone] Page: 1 of 1 Call Id: [id number] [place] Patient Name: Jamie [name] DOB: 1975-06-15 Initial Comment Caller States she thinks she may have appendicitis Nurse Assessment Nurse: Marcelline Deist, RN, Kermit Balo Date/Time (Eastern Time): 08/03/2015 9:05:05 AM Confirm and document reason for call. If symptomatic, describe symptoms. You must click the next button to save text entered. ---Caller States she thinks she may have appendicitis. She began having pain on the lower right side on & off yesterday. Had some diarrhea. has a hx of having cysts. The pain has not gone away. Has the patient traveled out of the country within the last 30 days? ---No Does the patient have any new or worsening symptoms? ---Yes Will a triage be completed? ---Yes Related visit to physician within the last 2 weeks? ---No Does the PT have any chronic conditions? (i.e. diabetes, asthma, etc.) ---Yes List chronic conditions. ---heart palpitations, ovarian cysts Is the patient pregnant or possibly pregnant? (Ask all females between the ages of 68-55) ---No Is this a behavioral health or substance abuse call? ---No Guidelines Guideline Title Affirmed  Question Affirmed Notes Pelvic Pain - Female [1] MILD-MODERATE pain AND [2] constant AND [3] present > 2 hours Final Disposition User See Physician within 4 Hours (or PCP triage) Marcelline Deist, RN, Kermit Balo Comments Patient scheduled to see NP at [facility] office as there is no availability at [facility] today. patient would prefer to be seen at office rather than go to an UC or the ER. Referrals GO TO FACILITY UNDECIDED REFERRED TO PCP OFFICE Disagree/Comply: Comply

## 2015-08-03 NOTE — Progress Notes (Signed)
Pre visit review using our clinic review tool, if applicable. No additional management support is needed unless otherwise documented below in the visit note. 

## 2015-08-03 NOTE — Telephone Encounter (Signed)
If she has lower right abdominal pain, I am going to send her to the ER so that they can do imaging.

## 2015-08-03 NOTE — Telephone Encounter (Signed)
Voicemail was left for pt to return my call

## 2015-08-06 ENCOUNTER — Encounter: Payer: BLUE CROSS/BLUE SHIELD | Admitting: Gastroenterology

## 2015-08-21 ENCOUNTER — Ambulatory Visit: Payer: BLUE CROSS/BLUE SHIELD | Admitting: Physician Assistant

## 2015-08-22 ENCOUNTER — Ambulatory Visit: Payer: BLUE CROSS/BLUE SHIELD | Admitting: Cardiology

## 2015-08-28 ENCOUNTER — Other Ambulatory Visit: Payer: Self-pay | Admitting: Physician Assistant

## 2015-10-17 ENCOUNTER — Ambulatory Visit (INDEPENDENT_AMBULATORY_CARE_PROVIDER_SITE_OTHER): Payer: BLUE CROSS/BLUE SHIELD | Admitting: Internal Medicine

## 2015-10-17 ENCOUNTER — Encounter: Payer: Self-pay | Admitting: Internal Medicine

## 2015-10-17 VITALS — BP 108/76 | HR 79 | Ht 65.0 in | Wt 176.2 lb

## 2015-10-17 DIAGNOSIS — R002 Palpitations: Secondary | ICD-10-CM | POA: Diagnosis not present

## 2015-10-17 NOTE — Progress Notes (Signed)
Patient Care Team: Lucille Passy, MD as PCP - General (Family Medicine)   HPI  Jamie [name] is a 40 y.o. female Seen in follow-up for palpitations caused by PACs/PVCs and nonsustained atrial tachycardia. She was seen 2/17.  In addition her impression of the symptoms emerged in the context of a viral illness raising the question of a low grade myocarditis as the cause. Echocardiogram obtained 4 months thereafter had shown normal LV function.  Metoprolol had been tried without affect. At that time we gave her prescriptions at last visit her atenolol, metoprolol succinate, and Inderal LA as well as diltiazem to see if we could find a therapeutic agent that was effective and tolerated  Metop succ not effective-- atenolol bettter with some lethargy but that is abating   palps better, problematic  mostly at night   Interval evaluation by an allergist and multiple food allergies were noted. She continues to associated palpitations with certain GI triggers  Past Medical History  Diagnosis Date  . Migraines   . Allergy   . Palpitations 11/29/2014    Holter monitor: Max/min heart rate 52-151 with average 82. Some sinus bradycardia and some sinus tachycardia. Rare PVCs; occasional PACs with 7 runs of roughly 3 beats. No arrhythmias.    Past Surgical History  Procedure Laterality Date  . Cesarean section      x3  . Wisdom tooth extraction    . Exercise tolerance test  12/14/2014    exercise for 9 minutes. No symptoms. No arrhythmias. No EKG changes. Low-risk    Current Outpatient Prescriptions  Medication Sig Dispense Refill  . atenolol (TENORMIN) 25 MG tablet Take 1 tablet (25 mg total) by mouth at bedtime. 30 tablet 2  . cetirizine (ZYRTEC) 10 MG tablet Take 10 mg by mouth as needed for allergies.    . Magnesium 300 MG CAPS Take 300 mg by mouth every other day.    . pantoprazole (PROTONIX) 40 MG tablet Take 1 tablet (40 mg total) by mouth every morning. 30 tablet 6  .  rizatriptan (MAXALT) 10 MG tablet Take 10 mg by mouth as needed for migraine. May repeat in 2 hours if needed    . dicyclomine (BENTYL) 20 MG tablet Take 1/2 tablet(10 mg) every 8 hours prn spasm. (Patient not taking: Reported on 10/17/2015) 30 tablet 0  . diltiazem (CARDIZEM) 60 MG tablet Take 1 tablet (60 mg total) by mouth at bedtime. (Patient not taking: Reported on 10/17/2015) 30 tablet 2  . hyoscyamine (LEVSIN SL) 0.125 MG SL tablet Place 1 tablet (0.125 mg total) under the tongue every 6 (six) hours as needed for cramping. (Patient not taking: Reported on 10/17/2015) 120 tablet 3  . propranolol ER (INDERAL LA) 60 MG 24 hr capsule Take 1 capsule (60 mg total) by mouth at bedtime. (Patient not taking: Reported on 10/17/2015) 30 capsule 2   No current facility-administered medications for this visit.    Allergies  Allergen Reactions  . Meperidine Hcl     REACTION: increased BP \T\ Pulse  . Sulfonamide Derivatives     REACTION: rash      Review of Systems negative except from HPI and PMH  Physical Exam BP 108/76 mmHg  Pulse 79  Ht 5' 5"  (1.651 m)  Wt 176 lb 3.2 oz (79.924 kg)  BMI 29.32 kg/m2  SpO2 99% Well developed and well nourished in no acute distress HENT normal E scleral and icterus clear Neck Supple JVP flat; carotids  brisk and full Clear to ausculation  Regular rate and rhythm, no murmurs gallops or rub Soft with active bowel sounds No clubbing cyanosis  Edema Alert and oriented, grossly normal motor and sensory function Skin Warm and Dry    Assessment and  Plan  Palpitations--PACs PVCs and nonsustained atrial tach  "She will continue on the atenolol for now. I've encouraged her not to try to "into her" too much lethargy. We have alternatives. She'll continue to work on food intake modification

## 2015-10-17 NOTE — Patient Instructions (Signed)
Medication Instructions: - Your physician recommends that you continue on your current medications as directed. Please refer to the Current Medication list given to you today.  Labwork: - none  Procedures/Testing: - none  Follow-Up: - Your physician wants you to follow-up in: 6 months with Dr. Klein. You will receive a reminder letter in the mail two months in advance. If you don't receive a letter, please call our office to schedule the follow-up appointment.  Any Additional Special Instructions Will Be Listed Below (If Applicable).     If you need a refill on your cardiac medications before your next appointment, please call your pharmacy.   

## 2015-11-23 ENCOUNTER — Other Ambulatory Visit: Payer: Self-pay | Admitting: Obstetrics and Gynecology

## 2015-11-23 DIAGNOSIS — R928 Other abnormal and inconclusive findings on diagnostic imaging of breast: Secondary | ICD-10-CM

## 2015-11-28 ENCOUNTER — Ambulatory Visit
Admission: RE | Admit: 2015-11-28 | Discharge: 2015-11-28 | Disposition: A | Discharge status: Home / Self Care | Payer: BLUE CROSS/BLUE SHIELD | Source: Ambulatory Visit | Attending: Obstetrics and Gynecology | Admitting: Obstetrics and Gynecology

## 2015-11-28 DIAGNOSIS — R928 Other abnormal and inconclusive findings on diagnostic imaging of breast: Secondary | ICD-10-CM

## 2015-11-29 ENCOUNTER — Encounter: Payer: Self-pay | Admitting: Internal Medicine

## 2015-12-03 ENCOUNTER — Telehealth: Payer: Self-pay | Admitting: Internal Medicine

## 2015-12-03 ENCOUNTER — Ambulatory Visit (INDEPENDENT_AMBULATORY_CARE_PROVIDER_SITE_OTHER): Payer: BLUE CROSS/BLUE SHIELD | Admitting: Internal Medicine

## 2015-12-03 ENCOUNTER — Encounter: Payer: Self-pay | Admitting: Internal Medicine

## 2015-12-03 ENCOUNTER — Telehealth: Payer: Self-pay | Admitting: Family Medicine

## 2015-12-03 VITALS — BP 132/80 | HR 60 | Ht 65.0 in | Wt 174.4 lb

## 2015-12-03 DIAGNOSIS — R002 Palpitations: Secondary | ICD-10-CM

## 2015-12-03 DIAGNOSIS — R110 Nausea: Secondary | ICD-10-CM

## 2015-12-03 DIAGNOSIS — R531 Weakness: Secondary | ICD-10-CM

## 2015-12-03 DIAGNOSIS — I959 Hypotension, unspecified: Secondary | ICD-10-CM

## 2015-12-03 DIAGNOSIS — R195 Other fecal abnormalities: Secondary | ICD-10-CM

## 2015-12-03 DIAGNOSIS — R6889 Other general symptoms and signs: Secondary | ICD-10-CM

## 2015-12-03 DIAGNOSIS — R5383 Other fatigue: Secondary | ICD-10-CM | POA: Diagnosis not present

## 2015-12-03 DIAGNOSIS — R42 Dizziness and giddiness: Secondary | ICD-10-CM

## 2015-12-03 LAB — COMPREHENSIVE METABOLIC PANEL
ALK PHOS: 57 U/L (ref 39–117)
ALT: 12 U/L (ref 0–35)
AST: 16 U/L (ref 0–37)
Albumin: 4.1 g/dL (ref 3.5–5.2)
BUN: 9 mg/dL (ref 6–23)
CALCIUM: 9.2 mg/dL (ref 8.4–10.5)
CHLORIDE: 106 meq/L (ref 96–112)
CO2: 28 meq/L (ref 19–32)
CREATININE: 0.72 mg/dL (ref 0.40–1.20)
GFR: 95.25 mL/min (ref >60.00)
GLUCOSE: 98 mg/dL (ref 70–99)
POTASSIUM: 4.1 meq/L (ref 3.5–5.1)
Sodium: 139 mEq/L (ref 135–145)
Total Bilirubin: 0.2 mg/dL (ref 0.2–1.2)
Total Protein: 7.2 g/dL (ref 6.0–8.3)

## 2015-12-03 LAB — MAGNESIUM: MAGNESIUM: 2.2 mg/dL (ref 1.5–2.5)

## 2015-12-03 LAB — CBC
HCT: 33.9 % — ABNORMAL LOW (ref 36.0–46.0)
HEMOGLOBIN: 11.4 g/dL — AB (ref 12.0–15.0)
MCHC: 33.8 g/dL (ref 30.0–36.0)
MCV: 92 fl (ref 78.0–100.0)
Platelets: 252 10*3/uL (ref 150.0–400.0)
RBC: 3.68 Mil/uL — ABNORMAL LOW (ref 3.87–5.11)
RDW: 13 % (ref 11.5–15.5)
WBC: 5.6 10*3/uL (ref 4.0–10.5)

## 2015-12-03 NOTE — Telephone Encounter (Signed)
Patient Name: RENEE ERB DOB: 11-13-1975 Initial Comment caller states she is fatigued, hands cold, low BP, body aches and light headed Nurse Assessment Nurse: Vallery Sa, RN, Cathy Date/Time (Eastern Time): 12/03/2015 12:57:14 PM Confirm and document reason for call. If symptomatic, describe symptoms. You must click the next button to save text entered. ---Rosea states she developed lightheadness and weakness about a week ago. Her blood pressure was 102/67 this morning. No severe breathing difficulty. No injury in the past week. No unusual bleeding. Alert and responsive. Has the patient traveled out of the country within the last 30 days? ---No Does the patient have any new or worsening symptoms? ---Yes Will a triage be completed? ---Yes Related visit to physician within the last 2 weeks? ---No Does the PT have any chronic conditions? (i.e. diabetes, asthma, etc.) ---Yes List chronic conditions. ---Palpitations Is the patient pregnant or possibly pregnant? (Ask all females between the ages of 78-55) ---No Is this a behavioral health or substance abuse call? ---No Guidelines Guideline Title Affirmed Question Affirmed Notes Low Blood Pressure [7] Fall in systolic BP > 20 mm Hg from normal AND [2] dizzy, lightheaded, or weak Final Disposition User Go to ED Now (or PCP triage) Vallery Sa, RN, [place] Area was able to scheduled 1:30pm appointment today with Webb Silversmith. Referrals REFERRED TO PCP OFFICE Disagree/Comply: Comply

## 2015-12-03 NOTE — Telephone Encounter (Signed)
i have asked Bton to see if we can add her on tomorrow am Or  Thursday am

## 2015-12-03 NOTE — Telephone Encounter (Signed)
Patient calling complaining of low BP's, being extremely tired, lightheaded, achy back and achy under sternum, and headaches in the evenings. BP lowest 94/66, highest 115/78 HR 78-90.  Patient stated she feels worse than she did last week. Patient stated that she is keeping hydrated and drinking plenty of fluids. Patient stated she stopped taking her atenolol and has not taken it since last week. Patient feels that she needs to come in and see someone. Will forward to Dr. [name] for advisement, and will see if there is an opening with a PA or NP.

## 2015-12-03 NOTE — Telephone Encounter (Signed)
Appointment scheduled for tomorrow 12/04/15 at 09:00AM

## 2015-12-03 NOTE — Telephone Encounter (Signed)
New Message  Pt c/o BP issue: STAT if pt c/o blurred vision, one-sided weakness or slurred speech  1. What are your last 5 BP readings? 6/26--- Right-98/76, left-113/78  2. Are you having any other symptoms (ex. Dizziness, headache, blurred vision, passed out)? Lightheadedness   3. What is your BP issue? bp low

## 2015-12-03 NOTE — Patient Instructions (Signed)
Fatigue  Fatigue is feeling tired all of the time, a lack of energy, or a lack of motivation. Occasional or mild fatigue is often a normal response to activity or life in general. However, long-lasting (chronic) or extreme fatigue may indicate an underlying medical condition.  HOME CARE INSTRUCTIONS   Watch your fatigue for any changes. The following actions may help to lessen any discomfort you are feeling:  · Talk to your health care provider about how much sleep you need each night. Try to get the required amount every night.  · Take medicines only as directed by your health care provider.  · Eat a healthy and nutritious diet. Ask your health care provider if you need help changing your diet.  · Drink enough fluid to keep your urine clear or pale yellow.  · Practice ways of relaxing, such as yoga, meditation, massage therapy, or acupuncture.  · Exercise regularly.    · Change situations that cause you stress. Try to keep your work and personal routine reasonable.  · Do not abuse illegal drugs.  · Limit alcohol intake to no more than 1 drink per day for nonpregnant women and 2 drinks per day for men. One drink equals 12 ounces of beer, 5 ounces of wine, or 1½ ounces of hard liquor.  · Take a multivitamin, if directed by your health care provider.  SEEK MEDICAL CARE IF:   · Your fatigue does not get better.  · You have a fever.    · You have unintentional weight loss or gain.  · You have headaches.    · You have difficulty:      Falling asleep.    Sleeping throughout the night.  · You feel angry, guilty, anxious, or sad.     · You are unable to have a bowel movement (constipation).    · You skin is dry.     · Your legs or another part of your body is swollen.    SEEK IMMEDIATE MEDICAL CARE IF:   · You feel confused.    · Your vision is blurry.  · You feel faint or pass out.    · You have a severe headache.    · You have severe abdominal, pelvic, or back pain.    · You have chest pain, shortness of breath, or an  irregular or fast heartbeat.    · You are unable to urinate or you urinate less than normal.    · You develop abnormal bleeding, such as bleeding from the rectum, vagina, nose, lungs, or nipples.  · You vomit blood.     · You have thoughts about harming yourself or committing suicide.    · You are worried that you might harm someone else.       This information is not intended to replace advice given to you by your health care provider. Make sure you discuss any questions you have with your health care provider.     Document Released: 03/23/2007 Document Revised: 06/16/2014 Document Reviewed: 09/27/2013  Elsevier Interactive Patient Education ©2016 [employer]

## 2015-12-03 NOTE — Progress Notes (Signed)
Subjective:    Patient ID: Jamie [name], female    DOB: 23-Sep-1975, 40 y.o.   MRN: [id number]  HPI  Pt presents to the clinic today with c/o low BP, fatigue, lightheadedness, nausea, loose stool and abnormal cold sensation. This started 1 week ago. She had her TSH, B12, Vit D checked yesterday, all normal.She stopped her Atenolol 1 week ago, thinking this may be the culprit, but symptoms continue. Her BP was as low as 98/55 but is 132//80 today and she still has these symptoms. She has had the nausea and loose stools for 3 weeks, 5-6 BM's per day. They are normal in color. She does note a foul odor. She denies blood in her stool. 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 She has not traveled out of the country recently. She has not been on antibiotics in the last 6 weeks. She has improved her diet, staying away from things she is allergic to. She denies changes in medications. She is a little stressed but denies anxiety, depression. She has seen GI in the past for ? Gallbladder issue. Gallbladder is fine. Was started on Protonix, which helped with reflux. She stopped it after 1 month. No upper GI/Colonoscopy.  Review of Systems      Past Medical History  Diagnosis Date  . Migraines   . Allergy   . Palpitations 11/29/2014    Holter monitor: Max/min heart rate 52-151 with average 82. Some sinus bradycardia and some sinus tachycardia. Rare PVCs; occasional PACs with 7 runs of roughly 3 beats. No arrhythmias.    Current Outpatient Prescriptions  Medication Sig Dispense Refill  . cetirizine (ZYRTEC) 10 MG tablet Take 10 mg by mouth as needed for allergies.    . Magnesium 300 MG CAPS Take 300 mg by mouth every other day.    . rizatriptan (MAXALT) 10 MG tablet Take 10 mg by mouth as needed for migraine. May repeat in 2 hours if needed    . atenolol (TENORMIN) 25 MG tablet Take 1 tablet (25 mg total) by mouth at bedtime.  (Patient not taking: Reported on 12/03/2015) 30 tablet 2   No current facility-administered medications for this visit.    Allergies  Allergen Reactions  . Meperidine Hcl     REACTION: increased BP \T\ Pulse  . Sulfonamide Derivatives     REACTION: rash    Family History  Problem Relation Age of Onset  . Adopted: Yes  . Hypertension Mother   . Skin cancer Mother   . Hyperlipidemia Father   . Arthritis Father   . Cancer Maternal Grandmother   . Rectal cancer Paternal Grandmother   . Heart disease Paternal Grandfather   . Skin cancer Paternal Grandmother   . Skin cancer Father   . Brain cancer Brother     Social History   Social History  . Marital Status: Married    Spouse Name: N/A  . Number of Children: N/A  . Years of Education: N/A   Occupational History  . Glass blower/designer     Social History Main Topics  . Smoking status: Never Smoker   . Smokeless tobacco: Never Used  . Alcohol Use: No  . Drug Use: No  . Sexual Activity: Yes   Other Topics Concern  . Not on file   Social History Narrative     Constitutional: Pt reports fatigue. Denies fever, headache or abrupt weight changes.  HEENT: Denies eye  pain, eye redness, ear pain, ringing in the ears, wax buildup, runny nose, nasal congestion, bloody nose, or sore throat. Respiratory: Denies difficulty breathing, shortness of breath, cough or sputum production.   Cardiovascular: Denies chest pain, chest tightness, palpitations or swelling in the hands or feet.  Gastrointestinal: Pt reports nausea and loose stool. Denies abdominal pain, bloating, constipation, or blood in the stool.  GU: Denies urgency, frequency, pain with urination, burning sensation, blood in urine, odor or discharge. Musculoskeletal: Pt reports generalized weakness. Denies decrease in range of motion, difficulty with gait, muscle pain or joint pain and swelling.  Skin: Denies redness, rashes, lesions or ulcercations.  Neurological: Pt reports  lightheadedness. Denies dizziness, difficulty with memory, difficulty with speech or problems with balance and coordination.  Psych: Pt reports stress. Denies anxiety, depression, SI/HI.  No other specific complaints in a complete review of systems (except as listed in HPI above).  Objective:   Physical Exam  BP 132/80 mmHg  Pulse 60  Ht 5' 5"  (1.651 m)  Wt 174 lb 6.4 oz (79.107 kg)  BMI 29.02 kg/m2  LMP 11/05/2015 Wt Readings from Last 3 Encounters:  12/03/15 174 lb 6.4 oz (79.107 kg)  10/17/15 176 lb 3.2 oz (79.924 kg)  08/03/15 179 lb (81.194 kg)    General: Appears her stated age, obese in NAD. Skin: Warm, dry and intact. No rashes, lesions or ulcerations noted. Neck:  No adenopathy noted. Cardiovascular: Normal rate and rhythm. S1,S2 noted.  No murmur, rubs or gallops noted. Pulmonary/Chest: Normal effort and positive vesicular breath sounds. No respiratory distress. No wheezes, rales or ronchi noted.  Abdomen: Soft and nontender. Hyperactive bowel sounds. No distention or masses noted. Liver, spleen and kidneys non palpable. Neurological: Alert and oriented.  Psychiatric: She is mildly anxious appearing today.  BMET    Component Value Date/Time   NA 139 06/27/2015 1200   K 4.1 06/27/2015 1200   CL 105 06/27/2015 1200   CO2 29 06/27/2015 1200   GLUCOSE 69* 06/27/2015 1200   BUN 12 06/27/2015 1200   CREATININE 0.75 06/27/2015 1200   CALCIUM 8.9 06/27/2015 1200   GFRNONAA >60 11/02/2014 2240   GFRAA >60 11/02/2014 2240    Lipid Panel  No results found for: CHOL, TRIG, HDL, CHOLHDL, VLDL, LDLCALC  CBC    Component Value Date/Time   WBC 5.8 07/25/2015 0922   RBC 4.00 07/25/2015 0922   HGB 12.3 07/25/2015 0922   HCT 36.0 07/25/2015 0922   PLT 240.0 07/25/2015 0922   MCV 89.8 07/25/2015 0922   MCH 30.3 11/02/2014 2240   MCHC 34.3 07/25/2015 0922   RDW 12.9 07/25/2015 0922   LYMPHSABS 1.6 07/25/2015 0922   MONOABS 0.5 07/25/2015 0922   EOSABS 0.2 07/25/2015  0922   BASOSABS 0.0 07/25/2015 0922    Hgb A1C No results found for: HGBA1C       Assessment & Plan:   Low BP, fatigue, lightheadedness, weakness, nausea, loose stool, abnormal cold sensation:  I do not think the Atenolol is the culprit, because BP is fine, HR is fine. She is not having palpitations at this time, so will hold Atenolol for now. TSH, B12 and Vit D drawn yesterday reviewed ECG today normal Will check CBC, CMET, magnesium Will check stool culture, ova and parasite and Cdiff Advised her to start taking a Probiotic OTC If labs, cultures normal, will refer back to GI.  Will follow up after labs, RTC as needed Webb Silversmith, NP

## 2015-12-03 NOTE — Telephone Encounter (Signed)
Pt has appt 12/03/15 at 1:30 with Avie Echevaria NP.

## 2015-12-04 ENCOUNTER — Ambulatory Visit: Payer: BLUE CROSS/BLUE SHIELD | Admitting: Physician Assistant

## 2015-12-04 ENCOUNTER — Ambulatory Visit (INDEPENDENT_AMBULATORY_CARE_PROVIDER_SITE_OTHER): Payer: BLUE CROSS/BLUE SHIELD | Admitting: Internal Medicine

## 2015-12-04 ENCOUNTER — Encounter: Payer: Self-pay | Admitting: Internal Medicine

## 2015-12-04 VITALS — BP 116/76 | HR 83 | Ht 65.0 in | Wt 174.8 lb

## 2015-12-04 DIAGNOSIS — I491 Atrial premature depolarization: Secondary | ICD-10-CM

## 2015-12-04 DIAGNOSIS — I493 Ventricular premature depolarization: Secondary | ICD-10-CM | POA: Diagnosis not present

## 2015-12-04 DIAGNOSIS — R002 Palpitations: Secondary | ICD-10-CM | POA: Diagnosis not present

## 2015-12-04 NOTE — Patient Instructions (Signed)
Medication Instructions: - Your physician recommends that you continue on your current medications as directed. Please refer to the Current Medication list given to you today.  Labwork: - none  Procedures/Testing: - Your physician has requested that you have an echocardiogram. Echocardiography is a painless test that uses sound waves to create images of your heart. It provides your doctor with information about the size and shape of your heart and how well your heart's chambers and valves are working. This procedure takes approximately one hour. There are no restrictions for this procedure.  Follow-Up: - Your physician wants you to follow-up in: November 2017 with Dr. [name]. You will receive a reminder letter in the mail two months in advance. If you don't receive a letter, please call our office to schedule the follow-up appointment.  ** please call sooner if you feel like you need to be seen before November **   Any Additional Special Instructions Will Be Listed Below (If Applicable). - Increase salt intake (Therma Tabs - two tablets by mouth twice daily)     If you need a refill on your cardiac medications before your next appointment, please call your pharmacy.

## 2015-12-04 NOTE — Progress Notes (Signed)
Patient Care Team: Lucille Passy, MD as PCP - General (Family Medicine)   HPI  Jamie [name] is a 40 y.o. female  She has a history of palpitations associated with PACs and PVCs with nonsustained atrial tachycardia. LV function was normal not withstanding the fact that the symptoms had emerged following viral illness. We have tried various beta blockers. Most recently atenolol was last tolerated. She called in complaining of hypotension lightheadedness and aching. She notes that she has been having some degree of diarrhea and loose stools since having been at the beach in early [place].  Today, not withstanding normal vital signs she continues to feel fatigued and achy.  There has been no orthopnea or peripheral edema. She has not noted dyspnea on exertion.  As she has stopped her atenolol because of low blood pressure or palpitations and been more problematic Records and Results Reviewed outside labs  Mostly pending  Past Medical History  Diagnosis Date  . Migraines   . Allergy   . Palpitations 11/29/2014    Holter monitor: Max/min heart rate 52-151 with average 82. Some sinus bradycardia and some sinus tachycardia. Rare PVCs; occasional PACs with 7 runs of roughly 3 beats. No arrhythmias.    Past Surgical History  Procedure Laterality Date  . Cesarean section      x3  . Wisdom tooth extraction    . Exercise tolerance test  12/14/2014    exercise for 9 minutes. No symptoms. No arrhythmias. No EKG changes. Low-risk    Current Outpatient Prescriptions  Medication Sig Dispense Refill  . atenolol (TENORMIN) 25 MG tablet Take 1 tablet (25 mg total) by mouth at bedtime. (Patient taking differently: Take 12.5 mg by mouth at bedtime. ) 30 tablet 2  . cetirizine (ZYRTEC) 10 MG tablet Take 10 mg by mouth as needed for allergies.    . rizatriptan (MAXALT) 10 MG tablet Take 10 mg by mouth as needed for migraine. May repeat in 2 hours if needed    . Magnesium 300 MG CAPS Take 300 mg  by mouth every other day. Reported on 12/04/2015     No current facility-administered medications for this visit.    Allergies  Allergen Reactions  . Meperidine Hcl     REACTION: increased BP \T\ Pulse  . Sulfonamide Derivatives     REACTION: rash      Review of Systems negative except from HPI and PMH  Physical Exam BP 116/76 mmHg  Pulse 83  Ht 5' 5"  (1.651 m)  Wt 174 lb 12 oz (79.266 kg)  BMI 29.08 kg/m2  LMP 11/05/2015 Well developed and well nourished in no acute distress HENT normal E scleral and icterus clear Neck Supple JVP flat; carotids brisk and full Clear to ausculation  *Regular rate and rhythm, no murmurs gallops or rub Soft with active bowel sounds No clubbing cyanosis  Edema Alert and oriented, grossly normal motor and sensory function Skin Warm and Dry    Assessment and  Plan  Atrial and ventricular ectopy-palpitations  Hypotension  Myalgias   The patient continues to feel ill and there is some question as to whether she might have a GI illness. I don't understand how her heart would be responsible for her myalgias; it could be contributing to hypotension although I suspect not. We will undertake an echocardiogram to confirm normal LV function  Blood pressure to be aggravated by the low-dose atenolol unfortunately which has been helpful in suppressing her palpitations. In  this regard I thought salt supplementation to her salt deplete diet may help maintain blood pressure well use of the atenolol. The event that hypotension persists and the palpitations remain problematic, we could use flecainide if her LV function is normal  Outside labs are pending related to GI issues. She also has had Lyme titer sent

## 2015-12-05 LAB — OVA AND PARASITE EXAMINATION: OP: NONE SEEN

## 2015-12-05 LAB — C. DIFFICILE GDH AND TOXIN A/B
C. DIFF TOXIN A/B: NOT DETECTED
C. difficile GDH: NOT DETECTED

## 2015-12-08 LAB — STOOL CULTURE

## 2015-12-13 ENCOUNTER — Other Ambulatory Visit: Payer: BLUE CROSS/BLUE SHIELD

## 2015-12-13 ENCOUNTER — Telehealth: Payer: Self-pay | Admitting: Internal Medicine

## 2015-12-13 NOTE — Telephone Encounter (Signed)
Pt calling stating she was in last week Has an echo 12/14/15 Has seen PCP today Noticed a difference  in BP from left and right arm Right arm lower BP  Was told to ask and let us know Was advised to lower her salt intake  Would like to let us know for she will be in tmrw for echo Please advise.

## 2015-12-13 NOTE — Telephone Encounter (Signed)
I spoke with the patient. She is coming in for an echo tomorrow per Dr. [name]. She states she saw a physician in [place] that she will see- Dr. Marrion Coy. They reported about an 8 point difference in her SBP when she was in the office. Per the patient, she will sometimes notice this at home. Per the patient, Dr. Terence Lux wanted her to let Dr. [name] know about her BP's as she felt the 8 point difference was not normal for someone her age.  She also states that she is still having about the same amount of lightheadedness that she was prior to increasing her salt intake.  She is unable to take the Therma Tabs as this has MSG in it and she has an allergy to MSG.  She is drinking V8 juice every day. When she is lightheaded, her BP is usually 90-104/60.  She has tried to take a 1/2 tablet of her atenolol to help with palpitations, but her BP is not supporting this very well. Palpitations are doing fairly well right now. She did have some last night when off her atenolol. I advised her I will review her BP with Dr. [name] and update him on her symptoms. She is agreeable.

## 2015-12-13 NOTE — Telephone Encounter (Signed)
The patient is aware of Dr. Olin Pia recommendations and verbalizes understanding.

## 2015-12-13 NOTE — Telephone Encounter (Signed)
i dont hthink 8 mm is too big She might be able to look into salt tabs from [facility]? MSG  For right now would not make any other changes   Holding atenolol makes sense with BP being low

## 2015-12-14 ENCOUNTER — Ambulatory Visit (INDEPENDENT_AMBULATORY_CARE_PROVIDER_SITE_OTHER): Payer: BLUE CROSS/BLUE SHIELD

## 2015-12-14 ENCOUNTER — Other Ambulatory Visit: Payer: Self-pay

## 2015-12-14 DIAGNOSIS — R002 Palpitations: Secondary | ICD-10-CM

## 2015-12-20 ENCOUNTER — Telehealth: Payer: Self-pay | Admitting: Internal Medicine

## 2015-12-20 NOTE — Telephone Encounter (Signed)
New message      Talk to the nurse about her echo.  She has a question

## 2015-12-20 NOTE — Telephone Encounter (Signed)
I called and spoke with the patient. Discussed echo results with the patient. She also states that she is being treated for Lyme's disease/ Mycoplasma. She is taking a 28 day course of doxycycline (this started about 2 weeks ago).  She was just wanting to notify Dr. [name] of this and see if this would change her follow up with him. She is not due to come back until November.  I advised her I will review with Dr. [name] and call her back. She is agreeable.

## 2015-12-26 NOTE — Telephone Encounter (Signed)
Per Dr. [name]- message reviewed- he would not recommend any change in treatment or follow up at this time. I left a message on the patient's identified voice mail of MD recommendations.

## 2016-01-18 ENCOUNTER — Other Ambulatory Visit: Payer: Self-pay | Admitting: Internal Medicine

## 2016-03-12 ENCOUNTER — Ambulatory Visit (INDEPENDENT_AMBULATORY_CARE_PROVIDER_SITE_OTHER): Payer: BLUE CROSS/BLUE SHIELD | Admitting: Family Medicine

## 2016-03-12 ENCOUNTER — Encounter: Payer: Self-pay | Admitting: Family Medicine

## 2016-03-12 VITALS — BP 120/80 | HR 64 | Wt 165.8 lb

## 2016-03-12 DIAGNOSIS — L989 Disorder of the skin and subcutaneous tissue, unspecified: Secondary | ICD-10-CM

## 2016-03-12 MED ORDER — MUPIROCIN 2 % EX OINT: 1.0000 "application " | TOPICAL_OINTMENT | Freq: Three times a day (TID) | CUTANEOUS | 1 refills | Status: DC

## 2016-03-12 MED ORDER — DOXYCYCLINE HYCLATE 100 MG PO CAPS: 100.0000 mg | ORAL_CAPSULE | Freq: Two times a day (BID) | ORAL | 0 refills | Status: DC

## 2016-03-12 NOTE — Progress Notes (Signed)
Subjective:    Patient ID: Jamie [name], female    DOB: 21-Jan-1976, 40 y.o.   MRN: [id number]  HPI This is a 40 yo female who presents today concerned that an area of redness on her abdomen is MRSA. 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 Her husband had MRSA about a month ago and she found a red area on her abdomen last month. That got better with some topical mupirocin. She noticed a new place yesterday on the left side of her abdomen under her waist band. It is red and painful. No fever. She started topical mupirocin last night. The area is under her waist band and she is not sure if it is irritated because of friction.  Has been seen at [place] and has had a month long course of doxy for a diagnois of lyme disease and is currently on herbal supplements (no antibiotics). She has been feeling better recently.   Past Medical History:  Diagnosis Date  . Allergy   . Migraines   . Palpitations 11/29/2014   Holter monitor: Max/min heart rate 52-151 with average 82. Some sinus bradycardia and some sinus tachycardia. Rare PVCs; occasional PACs with 7 runs of roughly 3 beats. No arrhythmias.   Past Surgical History:  Procedure Laterality Date  . CESAREAN SECTION     x3  . EXERCISE TOLERANCE TEST  12/14/2014   exercise for 9 minutes. No symptoms. No arrhythmias. No EKG changes. Low-risk  . WISDOM TOOTH EXTRACTION     Family History  Problem Relation Age of Onset  . Adopted: Yes  . Hypertension Mother   . Skin cancer Mother   . Hyperlipidemia Father   . Arthritis Father   . Cancer Maternal Grandmother   . Rectal cancer Paternal Grandmother   . Heart disease Paternal Grandfather   . Skin cancer Paternal Grandmother   . Skin cancer Father   . Brain cancer Brother    Social History  Substance Use Topics  . Smoking status: Never Smoker  . Smokeless tobacco: Never Used  . Alcohol use No      Review of Systems Per HPI    Objective:   Physical Exam  Constitutional: She is oriented to person, place, and time. She appears well-developed and well-nourished.  HENT:  Head: Normocephalic and atraumatic.  Cardiovascular: Normal rate.   Pulmonary/Chest: Effort normal.  Neurological: She is alert and oriented to person, place, and time.  Skin: Skin is warm and dry.     Psychiatric: She has a normal mood and affect. Her behavior is normal. Judgment and thought content normal.  Vitals reviewed.     BP 120/80   Pulse 64   Wt 165 lb 12.8 oz (75.2 kg)   LMP 02/25/2016   SpO2 96%   BMI 27.59 kg/m  Wt Readings from Last 3 Encounters:  03/12/16 165 lb 12.8 oz (75.2 kg)  12/04/15 174 lb 12 oz (79.3 kg)  12/03/15 174 lb 6.4 oz (79.1 kg)       Assessment & Plan:  1. Skin lesion - will continue mupirocin and obtain wound culture. I have provided a written prescription for doxy if area becomes larger, more painful, or if she develops a fever/chills - provided information about MRSA, encouraged continued vigilance.  - WOUND CULTURE - mupirocin ointment (BACTROBAN) 2 %; Apply 1 application topically 3 (three) times daily.  Dispense: 30 g; Refill: 1 - doxycycline (VIBRAMYCIN) 100 MG capsule; Take  1 capsule (100 mg total) by mouth 2 (two) times daily.  Dispense: 20 capsule; Refill: 0   Clarene Reamer, FNP-BC  [facility]  03/12/2016 11:05 AM

## 2016-03-12 NOTE — Patient Instructions (Signed)
MRSA Infection, Adult MRSA stands for methicillin-resistant Staphylococcus aureus. This type of infection is caused by Staphylococcus aureus bacteria that are no longer affected by the medicines used to kill them (drug resistant). Staphylococcus (staph) bacteria are normally found on the skin or in the nose of healthy people. In most cases, these bacteria do not cause infection. But if these resistant bacteria enter your body through a cut or sore, they can cause a serious infection on your skin or in other parts of your body. There is a slight chance that the staph on your skin or in your nose is MRSA. There are two types of MRSA infections:  Hospital-acquired MRSA is bacteria that you get in the hospital.  Community-acquired MRSA is bacteria that you get somewhere other than in a hospital. RISK FACTORS Hospital-acquired MRSA is more common. You could be at risk for this infection if you are in the hospital and you:  Have surgery or a procedure.  Have an IV access or a catheter tube placed in your body.  Have weak resistance to germs (weakened immune system).  Are elderly.  Are on kidney dialysis. You could be at risk for community-acquired MRSA if you have a break in your skin and come into contact with MRSA. This may happen if you:  Play sports where there is skin-to-skin contact.  Live in a crowded setting, like a dormitory or a [employer]  Share towels, razors, or sports equipment with other people. SYMPTOMS  Symptoms of hospital-acquired MRSA depend on where MRSA has spread. Symptoms may include:  Wound infection.  Skin infection.  Rash.  Pneumonia.  Fever and chills.  Difficulty breathing.  Chest pain. Community-acquired MRSA is most likely to start as a scratch or cut that becomes infected. Symptoms may include:  A pus-filled pimple.  A boil on your skin.  Pus draining from your skin.  A sore (abscess) under your skin or somewhere in your  body.  Fever with or without chills. DIAGNOSIS  The diagnosis of MRSA is made by taking a sample from an infected area and sending it to a lab for testing. A lab technician can grow (culture) MRSA and check it under a microscope. The cultured MRSA can be tested to see which type of antibiotic medicine will work to treat it. Newer tests can identify MRSA more quickly by testing bacteria samples for MRSA genes. Your health care provider can diagnose MRSA using samples from:   Cuts or wounds in infected areas.  Nasal swabs.  Saliva or cough specimens from deep in the lungs (sputum).  Urine.  Blood. You may also have:  Imaging studies (such as X-ray or MRI) to check if the infection has spread to the lungs, bones, or joints.  A culture and sensitivity test of blood or fluids from inside the joints. TREATMENT  Treatment depends on how severe, deep, or extensive the infection is. Very bad infections may require a hospital stay.  Some skin infections, such as a small boil or sore (abscess), may be treated by draining pus from the site of the infection.  More extensive surgery to drain pus may be necessary for deeper or more widespread soft tissue infections.  You may then have to take antibiotic medicine given by mouth or through a vein. You may start antibiotic treatment right away or after testing can be done to see what antibiotic medicine should be used. HOME CARE INSTRUCTIONS   Take your antibiotics as directed by your health care provider.  Take the medicine as prescribed until it is finished.  Avoid close contact with those around you as much as possible. Do not use towels, razors, toothbrushes, bedding, or other items that will be used by others.  Wash your hands frequently for 15 seconds with soap and water. Dry your hands with a clean or disposable towel.  When you are not able to wash your hands, use hand sanitizer that is more than 60 percent alcohol.  Wash towels, sheets,  or clothes in the washing machine with detergent and hot water. Dry them in a hot dryer.  Follow your health care provider's instructions for wound care. Wash your hands before and after changing your bandages.  Always shower after exercising.  Keep all cuts and scrapes clean and covered with a bandage.  Be sure to tell all your health care providers that you have MRSA so they are aware of your infection. SEEK MEDICAL CARE IF:  You have a cut, scrape, pimple, or boil that becomes red, swollen, or painful or has pus in it.  You have pus draining from your skin.  You have an abscess under your skin or somewhere in your body. SEEK IMMEDIATE MEDICAL CARE IF:   You have symptoms of a skin infection with a fever or chills.  You have trouble breathing.  You have chest pain.  You have a skin wound and you become nauseous or start vomiting. MAKE SURE YOU:  Understand these instructions.  Will watch your condition.  Will get help right away if you are not doing well or get worse.   This information is not intended to replace advice given to you by your health care provider. Make sure you discuss any questions you have with your health care provider.   Document Released: 05/26/2005 Document Revised: 10/10/2014 Document Reviewed: 03/18/2013 Elsevier Interactive Patient Education [employer].

## 2016-03-15 LAB — WOUND CULTURE
GRAM STAIN: NONE SEEN
GRAM STAIN: NONE SEEN
Gram Stain: NONE SEEN
ORGANISM ID, BACTERIA: NORMAL

## 2016-03-17 ENCOUNTER — Encounter: Payer: Self-pay | Admitting: Family Medicine

## 2016-04-24 ENCOUNTER — Ambulatory Visit (INDEPENDENT_AMBULATORY_CARE_PROVIDER_SITE_OTHER): Payer: BLUE CROSS/BLUE SHIELD | Admitting: Internal Medicine

## 2016-04-24 ENCOUNTER — Encounter: Payer: Self-pay | Admitting: Internal Medicine

## 2016-04-24 VITALS — BP 96/62 | HR 78 | Ht 65.0 in | Wt 164.5 lb

## 2016-04-24 DIAGNOSIS — I491 Atrial premature depolarization: Secondary | ICD-10-CM | POA: Diagnosis not present

## 2016-04-24 DIAGNOSIS — I493 Ventricular premature depolarization: Secondary | ICD-10-CM | POA: Diagnosis not present

## 2016-04-24 NOTE — Progress Notes (Signed)
      Patient Care Team: Lucille Passy, MD as PCP - General (Family Medicine)   HPI  Jamie [name] is a 40 y.o. female seen in follow-up for palpitations associated with PACs and PVCs with nonsustained atrial tachycardia.   LV function had been normal not withstanding the fact that the symptoms had emerged following viral illness. We had tried various beta blockers   7/17 repeat echo normal  In the interval she went [facility] [place]. They have diagnosed with chronic Lyme disease. She has been on alternating therapies with doxycycline and herbal therapies. She feels much better. She still has some palpitations primarily at night   There has been no orthopnea or peripheral edema. She has not noted dyspnea on exertion.     Past Medical History:  Diagnosis Date  . Allergy   . Migraines   . Palpitations 11/29/2014   Holter monitor: Max/min heart rate 52-151 with average 82. Some sinus bradycardia and some sinus tachycardia. Rare PVCs; occasional PACs with 7 runs of roughly 3 beats. No arrhythmias.    Past Surgical History:  Procedure Laterality Date  . CESAREAN SECTION     x3  . EXERCISE TOLERANCE TEST  12/14/2014   exercise for 9 minutes. No symptoms. No arrhythmias. No EKG changes. Low-risk  . WISDOM TOOTH EXTRACTION      Current Outpatient Prescriptions  Medication Sig Dispense Refill  . atenolol (TENORMIN) 25 MG tablet TAKE 1 TABLET BY MOUTH AT BEDTIME 30 tablet 6  . loratadine (CLARITIN) 10 MG tablet Take 10 mg by mouth daily.    . NON FORMULARY Herbal Treatments TID.    Marland Kitchen rizatriptan (MAXALT) 10 MG tablet Take 10 mg by mouth as needed for migraine. May repeat in 2 hours if needed     No current facility-administered medications for this visit.     Allergies  Allergen Reactions  . Meperidine Hcl     REACTION: increased BP \T\ Pulse  . Sulfonamide Derivatives     REACTION: rash      Review of Systems negative except from HPI and  PMH  Physical Exam BP 96/62 (BP Location: Right Arm, Patient Position: Sitting, Cuff Size: Normal)   Pulse 78   Ht 5' 5"  (1.651 m)   Wt 164 lb 8 oz (74.6 kg)   BMI 27.37 kg/m  Well developed and well nourished in no acute distress HENT normal E scleral and icterus clear Neck Supple Clear to ausculation  Regular rate and rhythm, no murmurs gallops or rub Soft   No clubbing cyanosis  Edema Alert and oriented, grossly normal motor and sensory function Skin Warm and Dry  ECG demonstrates sinus rhythm at 78 intervals 13/07/35 [name] otherwise normal  Assessment and  Plan  Atrial and ventricular ectopy-palpitations  Low blood pressure  No symptoms associated with her low blood pressure   Overall she is much improved. She is currently being treated for chronic Lyme  We will continue her low-dose atenolol as it seems to help with her palpitations.  We will see her in 6 months

## 2016-04-24 NOTE — Patient Instructions (Signed)
Medication Instructions: - Your physician recommends that you continue on your current medications as directed. Please refer to the Current Medication list given to you today.  Labwork: - none ordered  Procedures/Testing: - none ordered  Follow-Up: - Your physician wants you to follow-up in: 6 months with Dr. [name]. You will receive a reminder letter in the mail two months in advance. If you don't receive a letter, please call our office to schedule the follow-up appointment.  Any Additional Special Instructions Will Be Listed Below (If Applicable).     If you need a refill on your cardiac medications before your next appointment, please call your pharmacy.

## 2016-05-08 ENCOUNTER — Other Ambulatory Visit: Payer: Self-pay | Admitting: Obstetrics and Gynecology

## 2016-05-08 DIAGNOSIS — R928 Other abnormal and inconclusive findings on diagnostic imaging of breast: Secondary | ICD-10-CM

## 2016-06-12 ENCOUNTER — Ambulatory Visit
Admission: RE | Admit: 2016-06-12 | Discharge: 2016-06-12 | Disposition: A | Discharge status: Home / Self Care | Payer: BLUE CROSS/BLUE SHIELD | Source: Ambulatory Visit | Attending: Obstetrics and Gynecology | Admitting: Obstetrics and Gynecology

## 2016-06-12 DIAGNOSIS — R928 Other abnormal and inconclusive findings on diagnostic imaging of breast: Secondary | ICD-10-CM

## 2016-07-08 ENCOUNTER — Encounter: Payer: Self-pay | Admitting: Internal Medicine

## 2016-07-09 NOTE — Telephone Encounter (Signed)
Appt made w/ [name] tomorrow in [place] office. Pt thanks [place] for helping with this.

## 2016-07-10 ENCOUNTER — Encounter: Payer: Self-pay | Admitting: Internal Medicine

## 2016-07-10 ENCOUNTER — Ambulatory Visit (INDEPENDENT_AMBULATORY_CARE_PROVIDER_SITE_OTHER): Payer: BLUE CROSS/BLUE SHIELD | Admitting: Internal Medicine

## 2016-07-10 VITALS — BP 110/64 | HR 78 | Ht 65.0 in | Wt 166.8 lb

## 2016-07-10 DIAGNOSIS — I491 Atrial premature depolarization: Secondary | ICD-10-CM | POA: Diagnosis not present

## 2016-07-10 DIAGNOSIS — I493 Ventricular premature depolarization: Secondary | ICD-10-CM

## 2016-07-10 DIAGNOSIS — R002 Palpitations: Secondary | ICD-10-CM

## 2016-07-10 NOTE — Progress Notes (Signed)
      Patient Care Team: Lucille Passy, MD as PCP - General (Family Medicine)   HPI  Jamie [name] is a 41 y.o. female seen in follow-up for palpitations associated with PACs and PVCs with nonsustained atrial tachycardia.   LV function had been normal not withstanding the fact that the symptoms had emerged following viral illness. We had tried various beta blockers   7/17 repeat echo normal  In the interval she went to see [facility] in [place] and diagnosed with "chronic Lyme disease." treated with alternating doxycycline and herbal therapies with symptom improvement  She still has some palpitations primarily at night   There have been more palpitations of late, without clear assoc. Mostly in the late afternoon.  She feels lousy when they occur  Little caffeine and diet now sugar and gluten free  sVague and variable chest pains, most recently sharp dur < 1 min.  No exertional component  There has been no orthopnea or peripheral edema. She has not noted dyspnea on exertion.     Past Medical History:  Diagnosis Date  . Allergy   . Migraines   . Palpitations 11/29/2014   Holter monitor: Max/min heart rate 52-151 with average 82. Some sinus bradycardia and some sinus tachycardia. Rare PVCs; occasional PACs with 7 runs of roughly 3 beats. No arrhythmias.    Past Surgical History:  Procedure Laterality Date  . CESAREAN SECTION     x3  . EXERCISE TOLERANCE TEST  12/14/2014   exercise for 9 minutes. No symptoms. No arrhythmias. No EKG changes. Low-risk  . WISDOM TOOTH EXTRACTION      Current Outpatient Prescriptions  Medication Sig Dispense Refill  . atenolol (TENORMIN) 25 MG tablet TAKE 1 TABLET BY MOUTH AT BEDTIME 30 tablet 6  . loratadine (CLARITIN) 10 MG tablet Take 10 mg by mouth daily.    . NON FORMULARY Herbal Treatments TID.    Marland Kitchen rizatriptan (MAXALT) 10 MG tablet Take 10 mg by mouth as needed for migraine. May repeat in 2 hours if needed     No current  facility-administered medications for this visit.     Allergies  Allergen Reactions  . Meperidine Hcl     REACTION: increased BP \T\ Pulse  . Sulfonamide Derivatives     REACTION: rash      Review of Systems negative except from HPI and PMH  Physical Exam BP 110/64 (BP Location: Left Arm, Patient Position: Sitting, Cuff Size: Normal)   Pulse 78   Ht 5' 5"  (1.651 m)   Wt 166 lb 12 oz (75.6 kg)   BMI 27.75 kg/m  Well developed and well nourished in no acute distress HENT normal E scleral and icterus clear Neck Supple Clear to ausculation  Regular rate and rhythm, no murmurs gallops or rub Soft   No clubbing cyanosis  Edema Alert and oriented, grossly normal motor and sensory function Skin Warm and Dry  ECG demonstrates sinus rhythm at 78 intervals  13/08/38   otherwise normal  Assessment and  Plan  Atrial and ventricular ectopy-palpitations  Low blood pressure  No symptoms associated with her low blood pressure   Her Chest pains are not likely cardiac even in the context of Lyme.  Encouraged to continue exercise  Her afternoon palps suggested that she take her atenolol earlier if necessary    We will see her in 4  months

## 2016-07-10 NOTE — Patient Instructions (Signed)
Medication Instructions: - Your physician recommends that you continue on your current medications as directed. Please refer to the Current Medication list given to you today.  Labwork: - none ordered  Procedures/Testing: - none ordered  Follow-Up: - follow up with Dr. [name] as planned (recall in)  Any Additional Special Instructions Will Be Listed Below (If Applicable).     If you need a refill on your cardiac medications before your next appointment, please call your pharmacy.

## 2016-07-16 ENCOUNTER — Other Ambulatory Visit: Payer: Self-pay | Admitting: Internal Medicine

## 2016-10-08 ENCOUNTER — Telehealth: Payer: Self-pay | Admitting: Internal Medicine

## 2016-10-08 NOTE — Telephone Encounter (Signed)
Pt calling stating she's been having some on and off CP   Pt c/o of Chest Pain: STAT if CP now or developed within 24 hours  1. Are you having CP right now?  Dull ache, states she's had this before and it was nothing but doesn't want to risk it It started about lunch time, she was out shopping. She states it was hot so she went out and eat and she did a bit better.   2. Are you experiencing any other symptoms (ex. SOB, nausea, vomiting, sweating)?  Having some palpitations. But is taking arterenol for that.   3. How long have you been experiencing CP? Just today, couple times in the last week or so   4. Is your CP continuous or coming and going? Coming and going   5. Have you taken Nitroglycerin? No, took a baby Asprin  ?

## 2016-10-08 NOTE — Telephone Encounter (Signed)
Pt reports dull ache, on and off for the last couple of weeks. She has had 3 of these episodes which include palpitations. Today, while out shopping, she had 4/10 chest pressure, "not necessarily a sharp pain but enough to catch my attention". She felt clammy but had not eaten. Had lunch, she lay down and felt better. At this time, she feels like she has a pulled muscle in her left shoulder. No pain, "I just know it's there". No other symptoms.  She takes atenolol at dinner; last dose was last evening.  Per Dr. Olin Pia February notes, " Her Chest pains are not likely cardiac even in the context of Lyme.  Encouraged to continue exercise  Her afternoon palps suggested that she take her atenolol earlier if necessary "  She will take atenolol now, continue to monitor and proceed to ED if sx do not improve, worsen, or needs a sooner evaluation. She would like to move up May 17 appt to a sooner date and is agreeable to been seen at the [facility] office if needed. As it is after 5pm, will route to scheduling to contact the patient tomorrow.  Pt agreeable with plan.

## 2016-10-09 NOTE — Telephone Encounter (Signed)
I called and spoke with patient to be updated on symtoms and episodes. Patient states she still has a dull ache but better from yesterday. She states the pain is less the the 4/10 that it was yesterday. I told her that Dr. [name] will not be in the [address] office before 5/15 as he will be on vacation and there is no other availability any sooner then her 5/17 appt. I reiterated to her if her symptoms get worse to go to the ED otherwise just keep her 5/17 appt. She is agreeable.

## 2016-10-10 ENCOUNTER — Ambulatory Visit (INDEPENDENT_AMBULATORY_CARE_PROVIDER_SITE_OTHER): Payer: BLUE CROSS/BLUE SHIELD | Admitting: Family Medicine

## 2016-10-10 ENCOUNTER — Encounter: Payer: Self-pay | Admitting: Family Medicine

## 2016-10-10 VITALS — BP 105/68 | HR 82 | Temp 98.6°F | Ht 65.0 in | Wt 171.5 lb

## 2016-10-10 DIAGNOSIS — M940 Chondrocostal junction syndrome [Tietze]: Secondary | ICD-10-CM | POA: Diagnosis not present

## 2016-10-10 DIAGNOSIS — R0789 Other chest pain: Secondary | ICD-10-CM | POA: Diagnosis not present

## 2016-10-10 MED ORDER — DICLOFENAC SODIUM 75 MG PO TBEC: 75.0000 mg | DELAYED_RELEASE_TABLET | Freq: Two times a day (BID) | ORAL | 0 refills | Status: DC

## 2016-10-10 NOTE — Progress Notes (Signed)
   Subjective:    Patient ID: Jamie [name], female    DOB: September 05, 1975, 41 y.o.   MRN: [id number]  HPI   41 year old female pt of Dr. Hulen Shouts with history of anxiety presents with new onset pain in chest, intermittently  1-2 weeks. No association with food, movement or stress. Increase with deeps breaths. No cough, no SOB  No heartburn.  No fever.  Worsened 2 days ago, sudden onset.. Lasted several hours on 5/2  Pain is central to left, once lower in chest and to right. Has taken baby aspirin.   Has follow up with Cardiology Dr. [name]   5/17.  Hx of palpitations.. On atenolol for this.  She has history of chest pain.. Felt not likely cardiac per Dr. [name] in past.   ECHO: nml 2017 Stress test, treadmill: 12/2014 low risk  Holter monitor in past: 2016: Heart rate ranged from 52-151 bpm. Average rate 82 bpm. Rare PVCs. Intermittent PACs (occasionally couplets) 2 short runs of PAT Max heart rate was sinus tachycardia at 151 bpm Review of Systems  Constitutional: Negative for fatigue and fever.  HENT: Negative for ear pain.   Eyes: Negative for pain.  Respiratory: Negative for chest tightness and shortness of breath.   Cardiovascular: Positive for chest pain and palpitations. Negative for leg swelling.  Gastrointestinal: Negative for abdominal pain.  Genitourinary: Negative for dysuria.       Objective:   Physical Exam  Constitutional: Vital signs are normal. She appears well-developed and well-nourished. She is cooperative.  Non-toxic appearance. She does not appear ill. No distress.  HENT:  Head: Normocephalic.  Right Ear: Hearing, tympanic membrane, external ear and ear canal normal. Tympanic membrane is not erythematous, not retracted and not bulging.  Left Ear: Hearing, tympanic membrane, external ear and ear canal normal. Tympanic membrane is not erythematous, not retracted and not bulging.  Nose: No mucosal edema or rhinorrhea. Right sinus exhibits no maxillary sinus  tenderness and no frontal sinus tenderness. Left sinus exhibits no maxillary sinus tenderness and no frontal sinus tenderness.  Mouth/Throat: Uvula is midline, oropharynx is clear and moist and mucous membranes are normal.  Eyes: Conjunctivae, EOM and lids are normal. Pupils are equal, round, and reactive to light. Lids are everted and swept, no foreign bodies found.  Neck: Trachea normal and normal range of motion. Neck supple. Carotid bruit is not present. No thyroid mass and no thyromegaly present.  Cardiovascular: Normal rate, regular rhythm, S1 normal, S2 normal, normal heart sounds, intact distal pulses and normal pulses.  Exam reveals no gallop and no friction rub.   No murmur heard. Pulmonary/Chest: Effort normal and breath sounds normal. No tachypnea. No respiratory distress. She has no decreased breath sounds. She has no wheezes. She has no rhonchi. She has no rales. She exhibits tenderness.    Abdominal: Soft. Normal appearance and bowel sounds are normal. There is no tenderness.  Neurological: She is alert.  Skin: Skin is warm, dry and intact. No rash noted.  Psychiatric: Her speech is normal and behavior is normal. Judgment and thought content normal. Her mood appears not anxious. Cognition and memory are normal. She does not exhibit a depressed mood.          Assessment & Plan:

## 2016-10-10 NOTE — Progress Notes (Signed)
Pre visit review using our clinic review tool, if applicable. No additional management support is needed unless otherwise documented below in the visit note. 

## 2016-10-10 NOTE — Assessment & Plan Note (Signed)
EKG stable.  Most consistent with MSK pain.. Costochondritis. Treat with NSAIDs, heat/ice and chest wall stretching.

## 2016-10-10 NOTE — Patient Instructions (Addendum)
Use diclofenac twice daily for pain and inflammation associated with costochondritis.  Start chest wall stretching as discussed.  Costochondritis Costochondritis is swelling and irritation (inflammation) of the tissue (cartilage) that connects your ribs to your breastbone (sternum). This causes pain in the front of your chest. The pain usually starts gradually and involves more than one rib. What are the causes? The exact cause of this condition is not always known. It results from stress on the cartilage where your ribs attach to your sternum. The cause of this stress could be:  Chest injury (trauma).  Exercise or activity, such as lifting.  Severe coughing. What increases the risk? You may be at higher risk for this condition if you:  Are female.  Are 35?41 years old.  Recently started a new exercise or work activity. How is this treated? This condition usually goes away on its own over time. Your health care provider may prescribe an NSAID to reduce pain and inflammation. Your health care provider may also suggest that you:  Rest and avoid activities that make pain worse.  Apply heat or cold to the area to reduce pain and inflammation.  Do exercises to stretch your chest muscles. If these treatments do not help, your health care provider may inject a numbing medicine at the sternum-rib connection to help relieve the pain. Follow these instructions at home:  Avoid activities that make pain worse. This includes any activities that use chest, abdominal, and side muscles.  If directed, put ice on the painful area:  Put ice in a plastic bag.  Place a towel between your skin and the bag.  Leave the ice on for 20 minutes, 2-3 times a day.  If directed, apply heat to the affected area as often as told by your health care provider. Use the heat source that your health care provider recommends, such as a moist heat pack or a heating pad.  Place a towel between your skin and the  heat source.  Leave the heat on for 20-30 minutes.  Remove the heat if your skin turns bright red. This is especially important if you are unable to feel pain, heat, or cold. You may have a greater risk of getting burned.  Take over-the-counter and prescription medicines only as told by your health care provider.  Return to your normal activities as told by your health care provider. Ask your health care provider what activities are safe for you.  Keep all follow-up visits as told by your health care provider. This is important. Contact a health care provider if:  You have chills or a fever.  Your pain does not go away or it gets worse.  You have a cough that does not go away (is persistent). Get help right away if:  You have shortness of breath. This information is not intended to replace advice given to you by your health care provider. Make sure you discuss any questions you have with your health care provider. Document Released: 03/05/2005 Document Revised: 12/14/2015 Document Reviewed: 09/19/2015 Elsevier Interactive Patient Education  2017 [employer].

## 2016-10-23 ENCOUNTER — Ambulatory Visit (INDEPENDENT_AMBULATORY_CARE_PROVIDER_SITE_OTHER): Payer: BLUE CROSS/BLUE SHIELD | Admitting: Internal Medicine

## 2016-10-23 ENCOUNTER — Encounter: Payer: Self-pay | Admitting: Internal Medicine

## 2016-10-23 VITALS — BP 100/64 | HR 74 | Ht 65.0 in | Wt 171.2 lb

## 2016-10-23 DIAGNOSIS — I493 Ventricular premature depolarization: Secondary | ICD-10-CM

## 2016-10-23 DIAGNOSIS — I491 Atrial premature depolarization: Secondary | ICD-10-CM

## 2016-10-23 NOTE — Progress Notes (Signed)
Patient Care Team: Lucille Passy, MD as PCP - General (Family Medicine)   HPI  Jamie [name] is a 41 y.o. female seen in follow-up for palpitations associated with PACs and PVCs with nonsustained atrial tachycardia.   LV function normal ..>>7/17 repeat echo normal  In the interval she went [facility] in [place] and diagnosed with "chronic Lyme disease." treated with alternating doxycycline and herbal therapies with symptom improvement;  she is now on a herbal concoction from a doctor in [place].   She still has some palpitations although they are largely better   She's had some problems with chest pain. One of her Lyme titers was positive for an organism apparently associated with chest pain.  Chest pain is unassociated with shortness of breath and/or nausea and/or palpitations. It is aggravated a little bit by breathing. It is not radiating.  The patient denies shortness of breath, nocturnal dyspnea, orthopnea or peripheral edema  Mood is quite stable     Past Medical History:  Diagnosis Date  . Allergy   . Migraines   . Palpitations 11/29/2014   Holter monitor: Max/min heart rate 52-151 with average 82. Some sinus bradycardia and some sinus tachycardia. Rare PVCs; occasional PACs with 7 runs of roughly 3 beats. No arrhythmias.    Past Surgical History:  Procedure Laterality Date  . CESAREAN SECTION     x3  . EXERCISE TOLERANCE TEST  12/14/2014   exercise for 9 minutes. No symptoms. No arrhythmias. No EKG changes. Low-risk  . WISDOM TOOTH EXTRACTION      Current Outpatient Prescriptions  Medication Sig Dispense Refill  . atenolol (TENORMIN) 25 MG tablet TAKE 1 TABLET BY MOUTH AT BEDTIME 30 tablet 3  . loratadine (CLARITIN) 10 MG tablet Take 10 mg by mouth daily.    . naproxen sodium (ANAPROX) 220 MG tablet Take 220 mg by mouth as needed.    . NON FORMULARY Herbal Treatments TID.    Marland Kitchen rizatriptan (MAXALT) 10 MG tablet Take 10 mg by mouth as  needed for migraine. May repeat in 2 hours if needed    . diclofenac (VOLTAREN) 75 MG EC tablet Take 1 tablet (75 mg total) by mouth 2 (two) times daily. (Patient not taking: Reported on 10/23/2016) 30 tablet 0   No current facility-administered medications for this visit.     Allergies  Allergen Reactions  . Meperidine Hcl     REACTION: increased BP \T\ Pulse  . Sulfonamide Derivatives     REACTION: rash      Review of Systems negative except from HPI and PMH  Physical Exam BP 100/64 (BP Location: Left Arm, Patient Position: Sitting, Cuff Size: Normal)   Pulse 74   Ht 5' 5"  (1.651 m)   Wt 171 lb 4 oz (77.7 kg)   LMP 10/03/2016   BMI 28.50 kg/m  Well developed and nourished in no acute distress HENT normal Neck supple with JVP-flat Clear Regular rate and rhythm, no murmurs or gallops Abd-soft with active BS No Clubbing cyanosis edema Skin-warm and dry A & Oriented  Grossly normal sensory and motor function    ECG demonstrates sinus rhythm at 74 14/07/36  otherwise normal  Assessment and  Plan  Atrial and ventricular ectopy-palpitations  Low blood pressure  Chest pain-atypical  "chronic lyme disease"   Palpitations are stable. We'll continue her on atenolol which she is tolerating.  Blood pressure has not been an issue.  Chest pain is noncardiac  Hopefully her chronic Lyme continue to improve

## 2016-10-23 NOTE — Patient Instructions (Signed)
Medication Instructions: - Your physician recommends that you continue on your current medications as directed. Please refer to the Current Medication list given to you today.  Labwork: - none ordered  Procedures/Testing: - none ordered  Follow-Up: - Your physician wants you to follow-up in: 6 months with Dr. [name]. You will receive a reminder letter in the mail two months in advance. If you don't receive a letter, please call our office to schedule the follow-up appointment.   Any Additional Special Instructions Will Be Listed Below (If Applicable).     If you need a refill on your cardiac medications before your next appointment, please call your pharmacy.

## 2016-11-16 ENCOUNTER — Other Ambulatory Visit: Payer: Self-pay | Admitting: Internal Medicine

## 2016-12-09 ENCOUNTER — Other Ambulatory Visit: Payer: Self-pay | Admitting: Obstetrics and Gynecology

## 2016-12-09 DIAGNOSIS — N6320 Unspecified lump in the left breast, unspecified quadrant: Secondary | ICD-10-CM

## 2016-12-22 ENCOUNTER — Ambulatory Visit
Admission: RE | Admit: 2016-12-22 | Discharge: 2016-12-22 | Disposition: A | Discharge status: Home / Self Care | Payer: BLUE CROSS/BLUE SHIELD | Source: Ambulatory Visit | Attending: Obstetrics and Gynecology | Admitting: Obstetrics and Gynecology

## 2016-12-22 DIAGNOSIS — N6320 Unspecified lump in the left breast, unspecified quadrant: Secondary | ICD-10-CM

## 2017-02-13 ENCOUNTER — Other Ambulatory Visit: Payer: Self-pay | Admitting: Internal Medicine

## 2017-02-26 ENCOUNTER — Telehealth: Payer: Self-pay | Admitting: Internal Medicine

## 2017-02-26 NOTE — Telephone Encounter (Signed)
S/w patient. Patient has been experiencing chest pain over the past 3 weeks.  Over the past 2-3 days it has become more frequent. She will not have much in the morning but by the afternoon she has a "dull" ache that lingers.  She feels fatigued and has been napping in the afternoon. Her shoulders feel tense. Denies indigestion, dizziness, shortness of breath, nausea, vomiting or sweating when the chest pain is present. Feels better at rest. Patient has had chest pain in the past r/t Lyme's disease. Scheduled to see Jamie Bayley, NP tomorrow at 3pm. Pt verbalized understanding to call 911 or go to the emergency room, if he develops any new or worsening symptoms.  Routing to [place] for review of symptoms to make sure appointment appropriate.

## 2017-02-26 NOTE — Telephone Encounter (Signed)
Pt c/o of Chest Pain: STAT if CP now or developed within 24 hours  1. Are you having CP right now? No  2. Are you experiencing any other symptoms (ex. SOB, nausea, vomiting, sweating)? no  3. How long have you been experiencing CP? Last 3 weeks, every few days, but not every day on and off every 2-3 days  4. Is your CP continuous or coming and going? Comes and goes  5. Have you taken Nitroglycerin? no?

## 2017-02-27 ENCOUNTER — Emergency Department (HOSPITAL_COMMUNITY)
Admission: EM | Admit: 2017-02-27 | Discharge: 2017-02-27 | Disposition: A | Discharge status: Home / Self Care | Payer: BLUE CROSS/BLUE SHIELD | Attending: Emergency Medicine | Admitting: Emergency Medicine

## 2017-02-27 ENCOUNTER — Emergency Department (HOSPITAL_COMMUNITY): Payer: BLUE CROSS/BLUE SHIELD

## 2017-02-27 ENCOUNTER — Encounter (HOSPITAL_COMMUNITY): Payer: Self-pay

## 2017-02-27 ENCOUNTER — Ambulatory Visit: Payer: BLUE CROSS/BLUE SHIELD | Admitting: Nurse Practitioner

## 2017-02-27 DIAGNOSIS — Z79899 Other long term (current) drug therapy: Secondary | ICD-10-CM | POA: Diagnosis not present

## 2017-02-27 DIAGNOSIS — R079 Chest pain, unspecified: Secondary | ICD-10-CM | POA: Insufficient documentation

## 2017-02-27 DIAGNOSIS — F17290 Nicotine dependence, other tobacco product, uncomplicated: Secondary | ICD-10-CM | POA: Diagnosis not present

## 2017-02-27 LAB — BASIC METABOLIC PANEL
ANION GAP: 9 (ref 5–15)
BUN: 12 mg/dL (ref 6–20)
CALCIUM: 9 mg/dL (ref 8.9–10.3)
CO2: 24 mmol/L (ref 22–32)
Chloride: 104 mmol/L (ref 101–111)
Creatinine, Ser: 0.69 mg/dL (ref 0.44–1.00)
GLUCOSE: 93 mg/dL (ref 65–99)
POTASSIUM: 4 mmol/L (ref 3.5–5.1)
Sodium: 137 mmol/L (ref 135–145)

## 2017-02-27 LAB — CBC
HEMATOCRIT: 36.4 % (ref 36.0–46.0)
HEMOGLOBIN: 12.1 g/dL (ref 12.0–15.0)
MCH: 30.4 pg (ref 26.0–34.0)
MCHC: 33.2 g/dL (ref 30.0–36.0)
MCV: 91.5 fL (ref 78.0–100.0)
Platelets: 240 10*3/uL (ref 150–400)
RBC: 3.98 MIL/uL (ref 3.87–5.11)
RDW: 12.2 % (ref 11.5–15.5)
WBC: 6.8 10*3/uL (ref 4.0–10.5)

## 2017-02-27 LAB — POCT I-STAT TROPONIN I: Troponin i, poc: 0 ng/mL (ref 0.00–0.08)

## 2017-02-27 LAB — PREGNANCY, URINE: Preg Test, Ur: NEGATIVE

## 2017-02-27 MED ORDER — IOPAMIDOL (ISOVUE-300) INJECTION 61%
INTRAVENOUS | Status: AC
  Administered 2017-02-27: 80 mL via INTRAVENOUS
  Filled 2017-02-27: qty 75

## 2017-02-27 MED ORDER — IOPAMIDOL (ISOVUE-300) INJECTION 61%: 75.0000 mL | Freq: Once | INTRAVENOUS | Status: DC | PRN

## 2017-02-27 NOTE — Discharge Instructions (Signed)
Please read attached information. If you experience any new or worsening signs or symptoms please return to the emergency room for evaluation. Please follow-up with your primary care provider or specialist as discussed. Please use medication prescribed only as directed and discontinue taking if you have any concerning signs or symptoms.   °

## 2017-02-27 NOTE — ED Triage Notes (Signed)
Patient c/o intermittent pain under left breast area x 3  Weeks. Pain usually occurs at night. Patient was told to come to the ED if she had radiating pain or nausea. Patient had the chest pain and the pain radiated to the left shoulder and she had indigestion.

## 2017-02-27 NOTE — ED Provider Notes (Signed)
[facility] DEPT Provider Note   CSN: [id number] Arrival date & time: 02/27/17  7829     History   Chief Complaint Chief Complaint  Patient presents with  . Chest Pain    HPI Basil C Lazare is a 41 y.o. female.  HPI   41 year old female presents today with complaints of chest pain.  Patient reports approximately 3 weeks of intermittent chest pain.  She describes this as aching under her left breast and left lateral side.  She notes this lasts approximately an hour with general resolution.  She notes symptoms are not made worse with deep inspiration, palpation, positioning, or activity.  Patient denies any radiation of symptoms.  She denies any associated shortness of breath.  Patient reports history of similar chest pain, followed by cardiology with 2 echoes in the last several years, Holter monitor.  Patient notes this is similar in nature but lasting longer and recurring more often.  Patient denies any history of cardiac dysfunction, notes that he has been placed on atenolol for palpitations.  She denies any significant cardiac risk factors in her or close family, no smoking or drug use history.  She denies any history DVT or PE, denies any significant risk factors for this.  No lower extremity swelling or edema, pleuritic chest pain, shortness of breath.  Patient reports taking Aleve without significant improvement in her symptoms.  She reports mammogram last year showed a benign cyst in her left breast.   Cardiologist- Dr. [name] Cardiac studies-echo within normal limits in 2017 stress test on 12/2014 low risk, Holter monitor 2016 with range from 52-151 average of 82    Past Medical History:  Diagnosis Date  . Allergy   . Migraines   . Palpitations 11/29/2014   Holter monitor: Max/min heart rate 52-151 with average 82. Some sinus bradycardia and some sinus tachycardia. Rare PVCs; occasional PACs with 7 runs of roughly 3 beats. No arrhythmias.    Patient Active Problem List   Diagnosis Date Noted  . Costochondritis 10/10/2016  . Situational anxiety 01/31/2015  . Atypical chest pain 01/29/2015  . Anemia 10/23/2014  . Palpitations 10/23/2014  . MIGRAINE HEADACHE 07/06/2007    Past Surgical History:  Procedure Laterality Date  . CESAREAN SECTION     x3  . EXERCISE TOLERANCE TEST  12/14/2014   exercise for 9 minutes. No symptoms. No arrhythmias. No EKG changes. Low-risk  . WISDOM TOOTH EXTRACTION      OB History    No data available       Home Medications    Prior to Admission medications   Medication Sig Start Date End Date Taking? Authorizing Provider  atenolol (TENORMIN) 25 MG tablet TAKE 1 TABLET BY MOUTH AT BEDTIME 02/13/17  Yes Deboraha Sprang, MD  loratadine (CLARITIN) 10 MG tablet Take 10 mg by mouth daily.   Yes [provider]  naproxen sodium (ANAPROX) 220 MG tablet Take 220 mg by mouth as needed (pain).    Yes [provider]  NON FORMULARY 2 (two) times daily. Herbal Treatment for Lyme.   Yes [provider]  rizatriptan (MAXALT) 10 MG tablet Take 10 mg by mouth as needed for migraine. May repeat in 2 hours if needed   Yes [provider]  diclofenac (VOLTAREN) 75 MG EC tablet Take 1 tablet (75 mg total) by mouth 2 (two) times daily. Patient not taking: Reported on 10/23/2016 10/10/16   Jinny Sanders, MD    Family History Family History  Problem  Relation Age of Onset  . Adopted: Yes  . Hypertension Mother   . Skin cancer Mother   . Hyperlipidemia Father   . Arthritis Father   . Skin cancer Father   . Cancer Maternal Grandmother   . Rectal cancer Paternal Grandmother   . Skin cancer Paternal Grandmother   . Heart disease Paternal Grandfather   . Brain cancer Brother     Social History Social History  Substance Use Topics  . Smoking status: Current Every Day Smoker    Types: E-cigarettes  . Smokeless tobacco: Never Used  . Alcohol use No     Allergies   Meperidine hcl and Sulfonamide  derivatives   Review of Systems Review of Systems  All other systems reviewed and are negative.   Physical Exam Updated Vital Signs BP (!) 122/94 (BP Location: Left Arm)   Pulse 83   Temp 98.3 F (36.8 C) (Oral)   Resp 16   Ht 5' 5"  (1.651 m)   Wt 78 kg (172 lb)   LMP 02/01/2017 (Approximate)   SpO2 100%   BMI 28.62 kg/m   Physical Exam  Constitutional: She is oriented to person, place, and time. She appears well-developed and well-nourished.  HENT:  Head: Normocephalic and atraumatic.  Eyes: Pupils are equal, round, and reactive to light. Conjunctivae are normal. Right eye exhibits no discharge. Left eye exhibits no discharge. No scleral icterus.  Neck: Normal range of motion. No JVD present. No tracheal deviation present.  Cardiovascular: Normal rate, regular rhythm, normal heart sounds and intact distal pulses.  Exam reveals no gallop and no friction rub.   No murmur heard. Pulmonary/Chest: Effort normal and breath sounds normal. No stridor. No respiratory distress. She has no wheezes. She has no rales. She exhibits no tenderness.  Musculoskeletal: She exhibits no edema.  Neurological: She is alert and oriented to person, place, and time. Coordination normal.  Skin: No rash noted.  Psychiatric: She has a normal mood and affect. Her behavior is normal. Judgment and thought content normal.  Nursing note and vitals reviewed.     ED Treatments / Results  Labs (all labs ordered are listed, but only abnormal results are displayed) Labs Reviewed  BASIC METABOLIC PANEL  CBC  PREGNANCY, URINE  I-STAT TROPONIN, ED  POCT I-STAT TROPONIN I    EKG  EKG Interpretation  Date/Time:  Friday February 27 2017 09:02:19 EDT Ventricular Rate:  84 PR Interval:    QRS Duration: 91 QT Interval:  362 QTC Calculation: 428 R Axis:   56 Text Interpretation:  Sinus rhythm No significant change since last tracing Confirmed by Virgel Manifold [phone]) on 02/27/2017 12:03:05 PM        Radiology Dg Chest 2 View  Result Date: 02/27/2017 CLINICAL DATA:  Chest pain. EXAM: CHEST  2 VIEW COMPARISON:  Chest x-ray dated Nov 02, 2014. FINDINGS: The cardiomediastinal silhouette is normal in size. Normal pulmonary vascularity. There is an 10 mm nodular density projecting over the left lung apex on the AP view, not clearly seen on prior study. No focal consolidation, pleural effusion, or pneumothorax. No acute osseous abnormality. IMPRESSION: 10 mm nodular density projecting over the left lung apex on the AP view, not clearly seen on prior study. Recommend chest CT with contrast for further evaluation. Electronically Signed   By: Titus Dubin M.D.   On: 02/27/2017 09:54   Ct Chest W Contrast  Result Date: 02/27/2017 CLINICAL DATA:  Chest pain for the past 3 weeks. Fatigue. Abnormal  chest x-ray earlier today. EXAM: CT CHEST WITH CONTRAST TECHNIQUE: Multidetector CT imaging of the chest was performed during intravenous contrast administration. CONTRAST:  <See Chart> ISOVUE-300 IOPAMIDOL (ISOVUE-300) INJECTION 61% COMPARISON:  Chest x-ray from earlier same day. FINDINGS: Cardiovascular: Heart size is normal. No pericardial effusion. Thoracic aorta is normal in caliber and configuration. Mediastinum/Nodes: No mass or enlarged lymph nodes within the mediastinum or perihilar regions. Esophagus appears normal. Trachea and central bronchi are unremarkable. Lungs/Pleura: Lungs are clear.  No pleural effusion or pneumothorax. Upper Abdomen: No acute abnormality. Musculoskeletal: No chest wall abnormality. No acute or significant osseous findings. IMPRESSION: 1. Normal chest CT. 2. Questioned nodule seen on earlier chest x-ray was likely due to overlying normal costal cartilage. Electronically Signed   By: Franki Cabot M.D.   On: 02/27/2017 16:59    Procedures Procedures (including critical care time)  Medications Ordered in ED Medications  iopamidol (ISOVUE-300) 61 % injection 75 mL (not  administered)  iopamidol (ISOVUE-300) 61 % injection (80 mLs Intravenous Contrast Given 02/27/17 1650)     Initial Impression / Assessment and Plan / ED Course  I have reviewed the triage vital signs and the nursing notes.  Pertinent labs & imaging results that were available during my care of the patient were reviewed by me and considered in my medical decision making (see chart for details).     Final Clinical Impressions(s) / ED Diagnoses   Final diagnoses:  Nonspecific chest pain    Labs: Urine pregnancy, i-STAT troponin, BMP, CBC  Imaging: DG Chest 2 View  Consults:  Therapeutics:  Discharge Meds:   Assessment/Plan: 41 year old female presents today with complaints of chest pain.  This is ongoing for the last 3 weeks, similar symptoms prompted cardiac evaluation.  She has had significant cardiac evaluation previously with echo, stress test with no significant findings.  Patient has a very low heart score, low suspicion for ACS, patient is PERC negative with very low risk for PE.  Patient's chest x-ray showed nodular density recommending CT with contrast for further evaluation.  Patient will have CT here.   CT chest with no acute findings.  Patient's workup here very reassuring, uncertain etiology of her chest pain here, although I have very low suspicion for any acute life-threatening etiology.  Patient will continue outpatient follow-up with cardiology, primary care, she is given strict return precautions, she verbalized understanding and agreement to today's plan had no further questions or concerns at time discharge.   New Prescriptions New Prescriptions   No medications on file     Okey Regal, Hershal Coria 02/27/17 [place], [place], MD 02/28/17 [phone]

## 2017-02-27 NOTE — ED Notes (Signed)
Pt stated that she does not want her vital signs taken prior to discharge.

## 2017-04-24 LAB — HM DIABETES EYE EXAM

## 2017-05-14 ENCOUNTER — Other Ambulatory Visit: Payer: Self-pay | Admitting: Internal Medicine

## 2017-05-21 ENCOUNTER — Ambulatory Visit: Payer: BLUE CROSS/BLUE SHIELD | Admitting: Internal Medicine

## 2017-05-21 ENCOUNTER — Encounter: Payer: Self-pay | Admitting: Internal Medicine

## 2017-05-21 VITALS — BP 102/80 | HR 69 | Ht 65.0 in | Wt 178.5 lb

## 2017-05-21 DIAGNOSIS — I493 Ventricular premature depolarization: Secondary | ICD-10-CM

## 2017-05-21 DIAGNOSIS — I491 Atrial premature depolarization: Secondary | ICD-10-CM

## 2017-05-21 DIAGNOSIS — I471 Supraventricular tachycardia: Secondary | ICD-10-CM

## 2017-05-21 NOTE — Progress Notes (Signed)
Patient Care Team: Lucille Passy, MD as PCP - General (Family Medicine)   HPI  Jamie [name] is a 41 y.o. female seen with a chief complaint of  palpitations associated with PACs and PVCs with nonsustained atrial tachycardia.   LV function normal ..>>7/17 repeat echo normal  She has been diagnosed with "chronic Lyme disease." treated with alternating doxycycline and herbal therapies with symptom improvement;  she is now on a herbal concoction from a doctor in [place].  She recently was seen in the emergency room because of nonspecific chest pains.  Her Lyme disease doctor thought maybe it was related to this.  Palpitations are quiescent.  Exercise tolerance is good without shortness of breath nocturnal dyspnea or peripheral edema  The patient denies shortness of breath, nocturnal dyspnea, orthopnea or peripheral edema  She continues with mild chronic chest pain       Past Medical History:  Diagnosis Date  . Allergy   . Migraines   . Palpitations 11/29/2014   Holter monitor: Max/min heart rate 52-151 with average 82. Some sinus bradycardia and some sinus tachycardia. Rare PVCs; occasional PACs with 7 runs of roughly 3 beats. No arrhythmias.    Past Surgical History:  Procedure Laterality Date  . CESAREAN SECTION     x3  . EXERCISE TOLERANCE TEST  12/14/2014   exercise for 9 minutes. No symptoms. No arrhythmias. No EKG changes. Low-risk  . WISDOM TOOTH EXTRACTION      Current Outpatient Medications  Medication Sig Dispense Refill  . atenolol (TENORMIN) 25 MG tablet TAKE 1 TABLET BY MOUTH AT BEDTIME 30 tablet 3  . loratadine (CLARITIN) 10 MG tablet Take 10 mg by mouth daily.    Marland Kitchen NALTREXONE HCL PO Take 2.5 mg by mouth at bedtime.    . naproxen sodium (ANAPROX) 220 MG tablet Take 220 mg by mouth as needed (pain).     . NON FORMULARY 2 (two) times daily. Herbal Treatment for Lyme.    . NON FORMULARY 100 mg. Takes 3x weekly Monday, Wednesday and Friday.    .  rizatriptan (MAXALT) 10 MG tablet Take 10 mg by mouth as needed for migraine. May repeat in 2 hours if needed     No current facility-administered medications for this visit.     Allergies  Allergen Reactions  . Meperidine Hcl     REACTION: increased BP \T\ Pulse  . Sulfonamide Derivatives     REACTION: rash      Review of Systems negative except from HPI and PMH  Physical Exam BP 102/80 (BP Location: Left Arm, Patient Position: Sitting, Cuff Size: Normal)   Pulse 69   Ht 5' 5"  (1.651 m)   Wt 178 lb 8 oz (81 kg)   BMI 29.70 kg/m  Well developed and nourished in no acute distress HENT normal Neck supple with JVP-flat Clear Regular rate and rhythm, no murmurs or gallops Abd-soft with active BS No Clubbing cyanosis edema Skin-warm and dry A & Oriented  Grossly normal sensory and motor function     ECG to assess rhythm demonstrated sinus T9 without ectopy Intervals 15/07/38 Axis 43 Otherwise normal    Assessment and  Plan  Atrial and ventricular ectopy-palpitations  Low blood pressure  Chest pain-atypical  "chronic lyme disease"    Her ectopy is quiet.  We will continue atenolol which she is tolerating without difficulty.  She continues with some chest pains.  No further cardiac workup is indicated.  No  interval lightheadedness or presyncope.

## 2017-05-21 NOTE — Patient Instructions (Signed)
Medication Instructions: - Your physician recommends that you continue on your current medications as directed. Please refer to the Current Medication list given to you today.  Labwork: - none ordered  Procedures/Testing: - none odered  Follow-Up: - Your physician wants you to follow-up in: 1 year with Dr. [name]. You will receive a reminder letter in the mail two months in advance. If you don't receive a letter, please call our office to schedule the follow-up appointment.   Any Additional Special Instructions Will Be Listed Below (If Applicable).     If you need a refill on your cardiac medications before your next appointment, please call your pharmacy.

## 2017-09-14 ENCOUNTER — Telehealth: Payer: Self-pay | Admitting: Internal Medicine

## 2017-09-14 MED ORDER — ATENOLOL 25 MG PO TABS: 25.0000 mg | ORAL_TABLET | Freq: Every day | ORAL | 0 refills | Status: DC

## 2017-09-14 NOTE — Telephone Encounter (Signed)
Lmov for patient to schedule appointment

## 2017-09-14 NOTE — Telephone Encounter (Signed)
-----   Message from Anselm Pancoast, [place] sent at 09/11/2017 10:04 AM EDT ----- Please contact patient for a past due follow up.  Patient request a refill on medications.  Thanks, Arvilla Market

## 2017-09-14 NOTE — Telephone Encounter (Signed)
°*  STAT* If patient is at the pharmacy, call can be transferred to refill team.   1. Which medications need to be refilled? (please list name of each medication and dose if known)     Atenolol 25 mg po q d  2. Which pharmacy/location (including street and city if local pharmacy) is medication to be sent to?    [facility] [address] [place]  3. Do they need a 30 day or 90 day supply? 90  Out of meds

## 2017-09-15 ENCOUNTER — Other Ambulatory Visit: Payer: Self-pay

## 2017-09-15 MED ORDER — ATENOLOL 25 MG PO TABS: 25.0000 mg | ORAL_TABLET | Freq: Every day | ORAL | 3 refills | Status: DC

## 2017-09-15 NOTE — Telephone Encounter (Signed)
Pt is not due until 05/2018 Does not need apportionment at the moment.

## 2017-10-01 ENCOUNTER — Ambulatory Visit: Payer: Self-pay | Admitting: *Deleted

## 2017-10-01 NOTE — Telephone Encounter (Signed)
Pt having complaints of right sided abdominal   that is above the belly button that comes and goes for approximately a week. Pt states that after eating the pain may increase sometimes but the pt states she tries to maintain a health diet. Pt states that the pain is not excruciating and states it is hard to give a number as far as rating pain due to having IBS. Pt states that she has also noticed right shoulder pain that has started around the same time. Pt denies any nausea, constipation or issues with urinating. Pt verbalized understanding.   Reason for Disposition . [1] MODERATE pain (e.g., interferes with normal activities) AND [2] pain comes and goes (cramps) AND [3] present > 24 hours  (Exception: pain with Vomiting or Diarrhea - see that Guideline)  Answer Assessment - Initial Assessment Questions 1. LOCATION: "Where does it hurt?"      Depends on the day 2. RADIATION: "Does the pain shoot anywhere else?" (e.g., chest, back)     No 3. ONSET: "When did the pain begin?" (e.g., minutes, hours or days ago)      About a week ago 4. SUDDEN: "Gradual or sudden onset?"     Comes and goes 5. PATTERN "Does the pain come and go, or is it constant?"    - If constant: "Is it getting better, staying the same, or worsening?"      (Note: Constant means the pain never goes away completely; most serious pain is constant and it progresses)     - If intermittent: "How long does it last?" "Do you have pain now?"     (Note: Intermittent means the pain goes away completely between bouts)    Comes and goes 6. SEVERITY: "How bad is the pain?"  (e.g., Scale 1-10; mild, moderate, or severe)   - MILD (1-3): doesn't interfere with normal activities, abdomen soft and not tender to touch    - MODERATE (4-7): interferes with normal activities or awakens from sleep, tender to touch    - SEVERE (8-10): excruciating pain, doubled over, unable to do any normal activities     4-5 moderate 7. RECURRENT SYMPTOM: "Have you  ever had this type of abdominal pain before?" If so, ask: "When was the last time?" and "What happened that time?"      Yes approximately 3 years ago,but this is a different type of pain 8. CAUSE: "What do you think is causing the abdominal pain?"     unsure 9. RELIEVING/AGGRAVATING FACTORS: "What makes it better or worse?" (e.g., movement, antacids, bowel movement)     Has only taken ginger tea and alka seltzer 10. OTHER SYMPTOMS: "Has there been any vomiting, diarrhea, constipation, or urine problems?"       No 11. PREGNANCY: "Is there any chance you are pregnant?" "When was your last menstrual period?"       No, last menstrual cycle the end of last month-30th  Protocols used: ABDOMINAL PAIN - [facility]

## 2017-10-02 ENCOUNTER — Emergency Department (HOSPITAL_COMMUNITY): Payer: BLUE CROSS/BLUE SHIELD

## 2017-10-02 ENCOUNTER — Encounter (HOSPITAL_COMMUNITY): Payer: Self-pay

## 2017-10-02 ENCOUNTER — Ambulatory Visit: Payer: BLUE CROSS/BLUE SHIELD | Admitting: Family Medicine

## 2017-10-02 ENCOUNTER — Emergency Department (HOSPITAL_COMMUNITY)
Admission: EM | Admit: 2017-10-02 | Discharge: 2017-10-02 | Disposition: A | Discharge status: Home / Self Care | Payer: BLUE CROSS/BLUE SHIELD | Attending: Emergency Medicine | Admitting: Emergency Medicine

## 2017-10-02 ENCOUNTER — Other Ambulatory Visit: Payer: Self-pay

## 2017-10-02 DIAGNOSIS — R1011 Right upper quadrant pain: Secondary | ICD-10-CM | POA: Insufficient documentation

## 2017-10-02 DIAGNOSIS — Z79899 Other long term (current) drug therapy: Secondary | ICD-10-CM | POA: Insufficient documentation

## 2017-10-02 DIAGNOSIS — F17210 Nicotine dependence, cigarettes, uncomplicated: Secondary | ICD-10-CM | POA: Insufficient documentation

## 2017-10-02 LAB — I-STAT TROPONIN, ED: TROPONIN I, POC: 0 ng/mL (ref 0.00–0.08)

## 2017-10-02 LAB — COMPREHENSIVE METABOLIC PANEL
ALT: 19 U/L (ref 14–54)
AST: 23 U/L (ref 15–41)
Albumin: 3.9 g/dL (ref 3.5–5.0)
Alkaline Phosphatase: 54 U/L (ref 38–126)
Anion gap: 10 (ref 5–15)
BUN: 9 mg/dL (ref 6–20)
CHLORIDE: 108 mmol/L (ref 101–111)
CO2: 20 mmol/L — AB (ref 22–32)
CREATININE: 0.78 mg/dL (ref 0.44–1.00)
Calcium: 9.1 mg/dL (ref 8.9–10.3)
GFR calc non Af Amer: >60 mL/min (ref >60)
Glucose, Bld: 141 mg/dL — ABNORMAL HIGH (ref 65–99)
POTASSIUM: 4 mmol/L (ref 3.5–5.1)
SODIUM: 138 mmol/L (ref 135–145)
Total Bilirubin: 0.8 mg/dL (ref 0.3–1.2)
Total Protein: 6.8 g/dL (ref 6.5–8.1)

## 2017-10-02 LAB — URINALYSIS, ROUTINE W REFLEX MICROSCOPIC
Bilirubin Urine: NEGATIVE
GLUCOSE, UA: 50 mg/dL — AB
HGB URINE DIPSTICK: NEGATIVE
Ketones, ur: NEGATIVE mg/dL
LEUKOCYTES UA: NEGATIVE
Nitrite: NEGATIVE
PH: 7 (ref 5.0–8.0)
PROTEIN: NEGATIVE mg/dL
SPECIFIC GRAVITY, URINE: 1.002 — AB (ref 1.005–1.030)

## 2017-10-02 LAB — CBC
HEMATOCRIT: 33.9 % — AB (ref 36.0–46.0)
Hemoglobin: 11.3 g/dL — ABNORMAL LOW (ref 12.0–15.0)
MCH: 31 pg (ref 26.0–34.0)
MCHC: 33.3 g/dL (ref 30.0–36.0)
MCV: 93.1 fL (ref 78.0–100.0)
PLATELETS: 215 10*3/uL (ref 150–400)
RBC: 3.64 MIL/uL — AB (ref 3.87–5.11)
RDW: 12.2 % (ref 11.5–15.5)
WBC: 4.3 10*3/uL (ref 4.0–10.5)

## 2017-10-02 LAB — D-DIMER, QUANTITATIVE (NOT AT ARMC): D DIMER QUANT: 0.31 ug{FEU}/mL (ref 0.00–0.50)

## 2017-10-02 LAB — LIPASE, BLOOD: LIPASE: 24 U/L (ref 11–51)

## 2017-10-02 LAB — I-STAT BETA HCG BLOOD, ED (MC, WL, AP ONLY): I-stat hCG, quantitative: <5 m[IU]/mL (ref <5)

## 2017-10-02 MED ORDER — IOPAMIDOL (ISOVUE-370) INJECTION 76%
100.0000 mL | Freq: Once | INTRAVENOUS | Status: AC | PRN
  Administered 2017-10-02: 100 mL via INTRAVENOUS

## 2017-10-02 MED ORDER — IOPAMIDOL (ISOVUE-370) INJECTION 76%
INTRAVENOUS | Status: AC
  Filled 2017-10-02: qty 100

## 2017-10-02 NOTE — ED Provider Notes (Signed)
[facility] EMERGENCY DEPARTMENT Provider Note   CSN: [id number] Arrival date & time: 10/02/17  4098     History   Chief Complaint No chief complaint on file.   HPI Jamie [name] is a 42 y.o. female.  HPI 42 year old female presents today complaining of right upper quadrant pain with right shoulder pain intermittently for the past 2 weeks.  She describes it as coming and going.  The right shoulder pain is more severe than the right upper quadrant pain but they do come together.  She came to the ED today because the pain woke her up at about 2 AM.  It lasted approximately 30 minutes and has decreased.  This was the only time she had a nausea associated with it.  It does not appear to be associated with p.o. intake.  She has not had any vomiting or diarrhea.  She denies any cough or dyspnea.  She has not had similar symptoms in the past.  She had some pain between her shoulder blades last night.  She denies any lower extremity swelling or risk of DVT as well as history of DVT or PE.  Her menstrual cycles are normal with last menstrual cycle being the end of last month.  She is not using any birth control as her husband has had a vasectomy. Past Medical History:  Diagnosis Date  . Allergy   . Migraines   . Palpitations 11/29/2014   Holter monitor: Max/min heart rate 52-151 with average 82. Some sinus bradycardia and some sinus tachycardia. Rare PVCs; occasional PACs with 7 runs of roughly 3 beats. No arrhythmias.    Patient Active Problem List   Diagnosis Date Noted  . Costochondritis 10/10/2016  . Situational anxiety 01/31/2015  . Atypical chest pain 01/29/2015  . Anemia 10/23/2014  . Palpitations 10/23/2014  . MIGRAINE HEADACHE 07/06/2007    Past Surgical History:  Procedure Laterality Date  . CESAREAN SECTION     x3  . EXERCISE TOLERANCE TEST  12/14/2014   exercise for 9 minutes. No symptoms. No arrhythmias. No EKG changes. Low-risk  . WISDOM TOOTH  EXTRACTION       OB History   None      Home Medications    Prior to Admission medications   Medication Sig Start Date End Date Taking? Authorizing Provider  atenolol (TENORMIN) 25 MG tablet Take 1 tablet (25 mg total) by mouth at bedtime. 09/15/17   Deboraha Sprang, MD  loratadine (CLARITIN) 10 MG tablet Take 10 mg by mouth daily.    [provider]  NALTREXONE HCL PO Take 2.5 mg by mouth at bedtime.    [provider]  naproxen sodium (ANAPROX) 220 MG tablet Take 220 mg by mouth as needed (pain).     [provider]  NON FORMULARY 2 (two) times daily. Herbal Treatment for Lyme.    [provider]  NON FORMULARY 100 mg. Takes 3x weekly Monday, Wednesday and Friday.    [provider]  rizatriptan (MAXALT) 10 MG tablet Take 10 mg by mouth as needed for migraine. May repeat in 2 hours if needed    [provider]    Family History Family History  Adopted: Yes  Problem Relation Age of Onset  . Hypertension Mother   . Skin cancer Mother   . Hyperlipidemia Father   . Arthritis Father   . Skin cancer Father   . Cancer Maternal Grandmother   . Rectal cancer Paternal Grandmother   .  Skin cancer Paternal Grandmother   . Heart disease Paternal Grandfather   . Brain cancer Brother     Social History Social History   Tobacco Use  . Smoking status: Current Every Day Smoker    Types: E-cigarettes  . Smokeless tobacco: Never Used  Substance Use Topics  . Alcohol use: No    Alcohol/week: 0.0 oz  . Drug use: No     Allergies   Meperidine hcl and Sulfonamide derivatives   Review of Systems Review of Systems  Cardiovascular: Positive for chest pain.  Gastrointestinal: Positive for abdominal pain.  Endocrine: Negative.   Genitourinary: Negative.   Musculoskeletal: Negative.   Skin: Negative.   Allergic/Immunologic: Negative.   Neurological: Negative.   Hematological: Negative.   Psychiatric/Behavioral: Negative.     All other systems reviewed and are negative.    Physical Exam Updated Vital Signs BP (!) 141/92 (BP Location: Right Arm)   Pulse 91   Temp 98.4 F (36.9 C) (Oral)   Resp 14   SpO2 100%   Physical Exam   ED Treatments / Results  Labs (all labs ordered are listed, but only abnormal results are displayed) Labs Reviewed  COMPREHENSIVE METABOLIC PANEL - Abnormal; Notable for the following components:      Result Value   CO2 20 (*)    Glucose, Bld 141 (*)    All other components within normal limits  CBC - Abnormal; Notable for the following components:   RBC 3.64 (*)    Hemoglobin 11.3 (*)    HCT 33.9 (*)    All other components within normal limits  URINALYSIS, ROUTINE W REFLEX MICROSCOPIC - Abnormal; Notable for the following components:   Color, Urine STRAW (*)    Specific Gravity, Urine 1.002 (*)    Glucose, UA 50 (*)    All other components within normal limits  LIPASE, BLOOD  D-DIMER, QUANTITATIVE (NOT AT [facility])  I-STAT BETA HCG BLOOD, ED ([facility], [facility], [facility] ONLY)    EKG EKG Interpretation  Date/Time:  Friday [place] 26 2019 17:14:59 EDT Ventricular Rate:  75 PR Interval:  138 QRS Duration: 82 QT Interval:  374 QTC Calculation: 417 R Axis:   50 Text Interpretation:  Normal sinus rhythm Nonspecific ST abnormality Abnormal ECG No significant change since last tracing Confirmed by Pattricia Boss [phone]) on 10/02/2017 5:40:54 PM   Radiology Dg Chest 2 View  Result Date: 10/02/2017 CLINICAL DATA:  Cough wheezing and short of breath EXAM: CHEST - 2 VIEW COMPARISON:  02/27/2017 FINDINGS: The heart size and mediastinal contours are within normal limits. Both lungs are clear. The visualized skeletal structures are unremarkable. IMPRESSION: No active cardiopulmonary disease. Electronically Signed   By: Donavan Foil M.D.   On: 10/02/2017 17:53   Ct Angio Chest Pe W And/or Wo Contrast  Result Date: 10/02/2017 CLINICAL DATA:  Right-sided chest pain and shoulder pain beginning  this morning. EXAM: CT ANGIOGRAPHY CHEST WITH CONTRAST TECHNIQUE: Multidetector CT imaging of the chest was performed using the standard protocol during bolus administration of intravenous contrast. Multiplanar CT image reconstructions and MIPs were obtained to evaluate the vascular anatomy. CONTRAST:  80 cc ISOVUE-370 IOPAMIDOL (ISOVUE-370) INJECTION 76% COMPARISON:  02/27/2017 FINDINGS: Cardiovascular: The study is of quality for the evaluation of pulmonary embolism. There are no filling defects in the central, lobar, segmental or subsegmental pulmonary artery branches to suggest acute pulmonary embolism. Great vessels are normal in course and caliber. Normal heart size. No significant pericardial fluid/thickening. Mediastinum/Nodes: No discrete thyroid nodules.  Small amount of retained food in the distal esophagus possibly from reflux or incomplete emptying at time of imaging. No pathologically enlarged axillary, mediastinal or hilar lymph nodes. Lungs/Pleura: No pneumothorax. No pleural effusion. No dominant mass or pulmonary consolidations. Upper abdomen: 8 mm hypodensity in the right hepatic dome consistent with a small cyst or hemangioma. Tiny hepatic granuloma in the right lobe. No acute abnormality within the upper abdomen. Musculoskeletal:  No aggressive appearing focal osseous lesions. Review of the MIP images confirms the above findings. IMPRESSION: No acute pulmonary embolus.  Clear lungs. Electronically Signed   By: Ashley Royalty M.D.   On: 10/02/2017 21:00   US Abdomen Limited Ruq  Result Date: 10/02/2017 CLINICAL DATA:  Right upper quadrant pain for 1 week EXAM: ULTRASOUND ABDOMEN LIMITED RIGHT UPPER QUADRANT COMPARISON:  06/28/2015 FINDINGS: Gallbladder: No gallstones or wall thickening visualized. No sonographic Murphy sign noted by sonographer. Common bile duct: Diameter: 3 mm Liver: No focal lesion identified. Within normal limits in parenchymal echogenicity. Portal vein is patent on color  Doppler imaging with normal direction of blood flow towards the liver. IMPRESSION: Normal right upper quadrant ultrasound. Electronically Signed   By: Monte Fantasia M.D.   On: 10/02/2017 17:45    Procedures Procedures (including critical care time)  Medications Ordered in ED Medications - No data to display   Initial Impression / Assessment and Plan / ED Course  I have reviewed the triage vital signs and the nursing notes.  Pertinent labs & imaging results that were available during my care of the patient were reviewed by me and considered in my medical decision making (see chart for details).   42 year old female presents today with right upper quadrant and right shoulder pain as well as some upper back pain.  Pain concerning for biliary colic, however she has had no nausea or vomiting.  Ultrasound here without any evidence of gallstones.  No evidence of cardiac ischemia ischemia with normal EKG and troponin.  She has had no anterior chest pain.  CT obtained to evaluate great vessels and for pulmonary embolism with no evidence of abnormality noted.  No definitive source of pain found here.  I discussed with patient and advised regarding return precautions and need for close follow-up and she voices understanding.    Final Clinical Impressions(s) / ED Diagnoses   Final diagnoses:  Right upper quadrant pain    ED Discharge Orders    None       Pattricia Boss, MD 10/02/17 2117

## 2017-10-02 NOTE — ED Notes (Signed)
Patient transported to Ultrasound 

## 2017-10-02 NOTE — ED Notes (Signed)
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 I know it is my gallbladder can't they order my ultrasound?" pt reports having no hx of gallbladder issues. Pain is centralized radiating into right upper quadrant. Told the pt I would speak to the PA

## 2017-10-02 NOTE — ED Triage Notes (Signed)
Patient complains of epigastric and RUQ pain intermittently x 1 week. States that the pain comes after meals and awoke her this am 0200. Pain mild on arrival, no fever. States that she thinks its her gallbladder

## 2017-10-02 NOTE — Discharge Instructions (Addendum)
Ultrasound of your gallbladder showed no evidence of acute abnormality or gallstones CAT scan of your chest revealed no evidence of blood clots or abnormality of your large vessels Heart tracing and heart enzymes are normal If you have worsening pain, fever, or become short of breath please come back for reevaluation Please follow-up with your doctor this week

## 2017-10-29 ENCOUNTER — Ambulatory Visit: Payer: BLUE CROSS/BLUE SHIELD | Admitting: Internal Medicine

## 2017-11-24 ENCOUNTER — Other Ambulatory Visit: Payer: Self-pay | Admitting: Obstetrics and Gynecology

## 2017-11-24 DIAGNOSIS — N6320 Unspecified lump in the left breast, unspecified quadrant: Secondary | ICD-10-CM

## 2017-12-24 ENCOUNTER — Ambulatory Visit
Admission: RE | Admit: 2017-12-24 | Discharge: 2017-12-24 | Disposition: A | Discharge status: Home / Self Care | Payer: BLUE CROSS/BLUE SHIELD | Source: Ambulatory Visit | Attending: Obstetrics and Gynecology | Admitting: Obstetrics and Gynecology

## 2017-12-24 DIAGNOSIS — N6320 Unspecified lump in the left breast, unspecified quadrant: Secondary | ICD-10-CM

## 2018-06-25 ENCOUNTER — Ambulatory Visit
Admission: RE | Admit: 2018-06-25 | Discharge: 2018-06-25 | Disposition: A | Discharge status: Home / Self Care | Payer: BLUE CROSS/BLUE SHIELD | Source: Ambulatory Visit | Attending: Obstetrics and Gynecology | Admitting: Obstetrics and Gynecology

## 2018-06-25 ENCOUNTER — Other Ambulatory Visit: Payer: Self-pay | Admitting: Obstetrics and Gynecology

## 2018-06-25 DIAGNOSIS — R1031 Right lower quadrant pain: Secondary | ICD-10-CM

## 2018-06-25 MED ORDER — IOPAMIDOL (ISOVUE-300) INJECTION 61%
100.0000 mL | Freq: Once | INTRAVENOUS | Status: AC | PRN
  Administered 2018-06-25: 100 mL via INTRAVENOUS

## 2018-11-15 ENCOUNTER — Telehealth: Payer: Self-pay | Admitting: Internal Medicine

## 2018-11-15 ENCOUNTER — Other Ambulatory Visit: Payer: Self-pay

## 2018-11-15 MED ORDER — ATENOLOL 25 MG PO TABS: 25.0000 mg | ORAL_TABLET | Freq: Every day | ORAL | 0 refills | Status: DC

## 2018-11-15 NOTE — Telephone Encounter (Signed)
atenolol (TENORMIN) 25 MG tablet 90 tablet 0 11/15/2018    Sig - Route: Take 1 tablet (25 mg total) by mouth at bedtime. - Oral   Sent to pharmacy as: atenolol (TENORMIN) 25 MG tablet   Notes to Pharmacy: Patient needs an appointment for further refills, Thanks ! [phone]   E-Prescribing Status: Receipt confirmed by pharmacy (11/15/2018 12:10 PM EDT)   Pharmacy   [id number] [place], [place]

## 2018-11-15 NOTE — Telephone Encounter (Signed)
°*  STAT* If patient is at the pharmacy, call can be transferred to refill team.   1. Which medications need to be refilled? (please list name of each medication and dose if known) Atenolol 25 mg po qhs  2. Which pharmacy/location (including street and city if local pharmacy) is medication to be sent to?   [facility] [address] [place]  3. Do they need a 30 day or 90 day supply? [place]

## 2018-11-19 ENCOUNTER — Other Ambulatory Visit: Payer: Self-pay | Admitting: Obstetrics and Gynecology

## 2018-11-19 DIAGNOSIS — Z1231 Encounter for screening mammogram for malignant neoplasm of breast: Secondary | ICD-10-CM

## 2019-01-05 ENCOUNTER — Ambulatory Visit
Admission: RE | Admit: 2019-01-05 | Discharge: 2019-01-05 | Disposition: A | Discharge status: Home / Self Care | Payer: BC Managed Care – PPO | Source: Ambulatory Visit | Attending: Obstetrics and Gynecology | Admitting: Obstetrics and Gynecology

## 2019-01-05 ENCOUNTER — Other Ambulatory Visit: Payer: Self-pay

## 2019-01-05 DIAGNOSIS — Z1231 Encounter for screening mammogram for malignant neoplasm of breast: Secondary | ICD-10-CM

## 2019-02-06 ENCOUNTER — Other Ambulatory Visit: Payer: Self-pay | Admitting: Internal Medicine

## 2019-03-17 ENCOUNTER — Encounter: Payer: Self-pay | Admitting: Internal Medicine

## 2019-03-17 ENCOUNTER — Other Ambulatory Visit: Payer: Self-pay

## 2019-03-17 ENCOUNTER — Ambulatory Visit: Payer: BC Managed Care – PPO | Admitting: Internal Medicine

## 2019-03-17 VITALS — BP 120/80 | HR 69 | Temp 97.2°F | Ht 65.0 in | Wt 176.5 lb

## 2019-03-17 DIAGNOSIS — I493 Ventricular premature depolarization: Secondary | ICD-10-CM

## 2019-03-17 DIAGNOSIS — I471 Supraventricular tachycardia: Secondary | ICD-10-CM | POA: Diagnosis not present

## 2019-03-17 DIAGNOSIS — I491 Atrial premature depolarization: Secondary | ICD-10-CM | POA: Diagnosis not present

## 2019-03-17 MED ORDER — ATENOLOL 25 MG PO TABS: ORAL_TABLET | ORAL | 3 refills | Status: DC

## 2019-03-17 NOTE — Patient Instructions (Addendum)
Medication Instructions:  - Your physician recommends that you continue on your current medications as directed. Please refer to the Current Medication list given to you today.  If you need a refill on your cardiac medications before your next appointment, please call your pharmacy.   Lab work: - none ordered  If you have labs (blood work) drawn today and your tests are completely normal, you will receive your results only by: Marland Kitchen MyChart Message (if you have MyChart) OR . A paper copy in the mail If you have any lab test that is abnormal or we need to change your treatment, we will call you to review the results.  Testing/Procedures: - none ordered  Follow-Up: At [facility], you and your health needs are our priority.  As part of our continuing mission to provide you with exceptional heart care, we have created designated Provider Care Teams.  These Care Teams include your primary Cardiologist (physician) and Advanced Practice Providers (APPs -  Physician Assistants and Nurse Practitioners) who all work together to provide you with the care you need, when you need it.  . You will need a follow up appointment in 2 years (October 2022) with Dr. [name].   Marland Kitchen Please call our office 2 months in advance to schedule this appointment.  (Call in August 2022 to schedule)   Any Other Special Instructions Will Be Listed Below (If Applicable). - N/A

## 2019-03-17 NOTE — Progress Notes (Signed)
      Patient Care Team: Patient, No Pcp Per as PCP - General (General Practice)   HPI  Jamie [name] is a 43 y.o. female seen with a chief complaint of  palpitations associated with PACs and PVCs with nonsustained atrial tachycardia.   LV function normal ..>>7/17 repeat echo normal  She has been diagnosed with "chronic Lyme disease." treated with alternating doxycycline and herbal therapies with symptom improvement;she continues with [facility]  Palpitations are mostly quiescient.  She noted some problems after she was painting; thinks is related to stress.  Most notable at night.  She is exercising without difficulties.     Past Medical History:  Diagnosis Date  . Allergy   . Migraines   . Palpitations 11/29/2014   Holter monitor: Max/min heart rate 52-151 with average 82. Some sinus bradycardia and some sinus tachycardia. Rare PVCs; occasional PACs with 7 runs of roughly 3 beats. No arrhythmias.    Past Surgical History:  Procedure Laterality Date  . CESAREAN SECTION     x3  . EXERCISE TOLERANCE TEST  12/14/2014   exercise for 9 minutes. No symptoms. No arrhythmias. No EKG changes. Low-risk  . WISDOM TOOTH EXTRACTION      Current Outpatient Medications  Medication Sig Dispense Refill  . atenolol (TENORMIN) 25 MG tablet TAKE 1 TABLET BY MOUTH EVERYDAY AT BEDTIME 90 tablet 0  . loratadine (CLARITIN) 10 MG tablet Take 10 mg by mouth daily as needed.     . naproxen sodium (ANAPROX) 220 MG tablet Take 220 mg by mouth as needed (pain).     . NON FORMULARY 2 (two) times daily. Herbal Treatment for Lyme.    . NON FORMULARY 75 mg. PREGLENOLONE Takes 3x weekly Monday, Wednesday and Friday.    . rizatriptan (MAXALT) 10 MG tablet Take 10 mg by mouth as needed for migraine. May repeat in 2 hours if needed     No current facility-administered medications for this visit.     Allergies  Allergen Reactions  . Meperidine Hcl     REACTION: increased BP \T\ Pulse  .  Sulfonamide Derivatives     REACTION: rash      Review of Systems negative except from HPI and PMH  Physical Exam BP 120/80 (BP Location: Left Arm, Patient Position: Sitting, Cuff Size: Normal)   Pulse 69   Temp (!) 97.2 F (36.2 C)   Ht 5' 5"  (1.651 m)   Wt 176 lb 8 oz (80.1 kg)   SpO2 98%   BMI 29.37 kg/m  Well developed and nourished in no acute distress HENT normal Neck supple with JVP-  flat *  Clear Regular rate and rhythm, no murmurs or gallops Abd-soft with active BS No Clubbing cyanosis edema Skin-warm and dry A & Oriented  Grossly normal sensory and motor function  ECG sinus at 69- 14/08/37 Axis 57 Otherwise normal    Assessment and  Plan  Atrial and ventricular ectopy-palpitations  Low blood pressure  "chronic lyme disease"   Continue atenolol.  She can take an extra 1 at night if she is particularly symptomatic.

## 2019-12-14 ENCOUNTER — Other Ambulatory Visit: Payer: Self-pay | Admitting: Obstetrics and Gynecology

## 2019-12-14 DIAGNOSIS — Z1231 Encounter for screening mammogram for malignant neoplasm of breast: Secondary | ICD-10-CM

## 2020-01-06 ENCOUNTER — Ambulatory Visit
Admission: RE | Admit: 2020-01-06 | Discharge: 2020-01-06 | Disposition: A | Discharge status: Home / Self Care | Payer: BC Managed Care – PPO | Source: Ambulatory Visit | Attending: Obstetrics and Gynecology | Admitting: Obstetrics and Gynecology

## 2020-01-06 ENCOUNTER — Other Ambulatory Visit: Payer: Self-pay

## 2020-01-06 DIAGNOSIS — Z1231 Encounter for screening mammogram for malignant neoplasm of breast: Secondary | ICD-10-CM

## 2020-04-10 DIAGNOSIS — M62830 Muscle spasm of back: Secondary | ICD-10-CM | POA: Diagnosis not present

## 2020-04-10 DIAGNOSIS — M5414 Radiculopathy, thoracic region: Secondary | ICD-10-CM | POA: Diagnosis not present

## 2020-04-10 DIAGNOSIS — M9903 Segmental and somatic dysfunction of lumbar region: Secondary | ICD-10-CM | POA: Diagnosis not present

## 2020-04-10 DIAGNOSIS — M9902 Segmental and somatic dysfunction of thoracic region: Secondary | ICD-10-CM | POA: Diagnosis not present

## 2020-04-30 DIAGNOSIS — R109 Unspecified abdominal pain: Secondary | ICD-10-CM | POA: Diagnosis not present

## 2020-04-30 DIAGNOSIS — E60 Dietary zinc deficiency: Secondary | ICD-10-CM | POA: Diagnosis not present

## 2020-04-30 DIAGNOSIS — R5382 Chronic fatigue, unspecified: Secondary | ICD-10-CM | POA: Diagnosis not present

## 2020-04-30 DIAGNOSIS — E538 Deficiency of other specified B group vitamins: Secondary | ICD-10-CM | POA: Diagnosis not present

## 2020-04-30 DIAGNOSIS — R195 Other fecal abnormalities: Secondary | ICD-10-CM | POA: Diagnosis not present

## 2020-05-08 DIAGNOSIS — M62830 Muscle spasm of back: Secondary | ICD-10-CM | POA: Diagnosis not present

## 2020-05-08 DIAGNOSIS — M9902 Segmental and somatic dysfunction of thoracic region: Secondary | ICD-10-CM | POA: Diagnosis not present

## 2020-05-08 DIAGNOSIS — M5414 Radiculopathy, thoracic region: Secondary | ICD-10-CM | POA: Diagnosis not present

## 2020-05-08 DIAGNOSIS — M9903 Segmental and somatic dysfunction of lumbar region: Secondary | ICD-10-CM | POA: Diagnosis not present

## 2020-05-13 ENCOUNTER — Other Ambulatory Visit: Payer: Self-pay | Admitting: Internal Medicine

## 2020-05-16 DIAGNOSIS — R002 Palpitations: Secondary | ICD-10-CM | POA: Diagnosis not present

## 2020-05-16 DIAGNOSIS — R5382 Chronic fatigue, unspecified: Secondary | ICD-10-CM | POA: Diagnosis not present

## 2020-05-16 DIAGNOSIS — G43909 Migraine, unspecified, not intractable, without status migrainosus: Secondary | ICD-10-CM | POA: Diagnosis not present

## 2020-05-16 DIAGNOSIS — E60 Dietary zinc deficiency: Secondary | ICD-10-CM | POA: Diagnosis not present

## 2020-06-05 ENCOUNTER — Other Ambulatory Visit: Payer: Self-pay | Admitting: Internal Medicine

## 2020-06-12 DIAGNOSIS — M5414 Radiculopathy, thoracic region: Secondary | ICD-10-CM | POA: Diagnosis not present

## 2020-06-12 DIAGNOSIS — M9903 Segmental and somatic dysfunction of lumbar region: Secondary | ICD-10-CM | POA: Diagnosis not present

## 2020-06-12 DIAGNOSIS — M9902 Segmental and somatic dysfunction of thoracic region: Secondary | ICD-10-CM | POA: Diagnosis not present

## 2020-06-12 DIAGNOSIS — M62830 Muscle spasm of back: Secondary | ICD-10-CM | POA: Diagnosis not present

## 2020-06-30 ENCOUNTER — Other Ambulatory Visit: Payer: Self-pay | Admitting: Internal Medicine

## 2020-07-10 DIAGNOSIS — M9902 Segmental and somatic dysfunction of thoracic region: Secondary | ICD-10-CM | POA: Diagnosis not present

## 2020-07-10 DIAGNOSIS — M9903 Segmental and somatic dysfunction of lumbar region: Secondary | ICD-10-CM | POA: Diagnosis not present

## 2020-07-10 DIAGNOSIS — M62830 Muscle spasm of back: Secondary | ICD-10-CM | POA: Diagnosis not present

## 2020-07-10 DIAGNOSIS — M5414 Radiculopathy, thoracic region: Secondary | ICD-10-CM | POA: Diagnosis not present

## 2020-07-27 DIAGNOSIS — Z01411 Encounter for gynecological examination (general) (routine) with abnormal findings: Secondary | ICD-10-CM | POA: Diagnosis not present

## 2020-07-27 DIAGNOSIS — Z01419 Encounter for gynecological examination (general) (routine) without abnormal findings: Secondary | ICD-10-CM | POA: Diagnosis not present

## 2020-07-27 DIAGNOSIS — Z6828 Body mass index (BMI) 28.0-28.9, adult: Secondary | ICD-10-CM | POA: Diagnosis not present

## 2020-07-27 DIAGNOSIS — Z124 Encounter for screening for malignant neoplasm of cervix: Secondary | ICD-10-CM | POA: Diagnosis not present

## 2020-08-07 DIAGNOSIS — M9903 Segmental and somatic dysfunction of lumbar region: Secondary | ICD-10-CM | POA: Diagnosis not present

## 2020-08-07 DIAGNOSIS — M9902 Segmental and somatic dysfunction of thoracic region: Secondary | ICD-10-CM | POA: Diagnosis not present

## 2020-08-07 DIAGNOSIS — M5414 Radiculopathy, thoracic region: Secondary | ICD-10-CM | POA: Diagnosis not present

## 2020-08-07 DIAGNOSIS — M62830 Muscle spasm of back: Secondary | ICD-10-CM | POA: Diagnosis not present

## 2020-09-11 DIAGNOSIS — M62830 Muscle spasm of back: Secondary | ICD-10-CM | POA: Diagnosis not present

## 2020-09-11 DIAGNOSIS — M9902 Segmental and somatic dysfunction of thoracic region: Secondary | ICD-10-CM | POA: Diagnosis not present

## 2020-09-11 DIAGNOSIS — M5414 Radiculopathy, thoracic region: Secondary | ICD-10-CM | POA: Diagnosis not present

## 2020-09-11 DIAGNOSIS — M9903 Segmental and somatic dysfunction of lumbar region: Secondary | ICD-10-CM | POA: Diagnosis not present

## 2020-10-16 DIAGNOSIS — M62830 Muscle spasm of back: Secondary | ICD-10-CM | POA: Diagnosis not present

## 2020-10-16 DIAGNOSIS — M9903 Segmental and somatic dysfunction of lumbar region: Secondary | ICD-10-CM | POA: Diagnosis not present

## 2020-10-16 DIAGNOSIS — M5414 Radiculopathy, thoracic region: Secondary | ICD-10-CM | POA: Diagnosis not present

## 2020-10-16 DIAGNOSIS — M9902 Segmental and somatic dysfunction of thoracic region: Secondary | ICD-10-CM | POA: Diagnosis not present

## 2020-10-26 DIAGNOSIS — H5213 Myopia, bilateral: Secondary | ICD-10-CM | POA: Diagnosis not present

## 2020-10-26 DIAGNOSIS — H524 Presbyopia: Secondary | ICD-10-CM | POA: Diagnosis not present

## 2020-11-20 DIAGNOSIS — M9903 Segmental and somatic dysfunction of lumbar region: Secondary | ICD-10-CM | POA: Diagnosis not present

## 2020-11-20 DIAGNOSIS — M5414 Radiculopathy, thoracic region: Secondary | ICD-10-CM | POA: Diagnosis not present

## 2020-11-20 DIAGNOSIS — M9902 Segmental and somatic dysfunction of thoracic region: Secondary | ICD-10-CM | POA: Diagnosis not present

## 2020-11-20 DIAGNOSIS — M62830 Muscle spasm of back: Secondary | ICD-10-CM | POA: Diagnosis not present

## 2020-12-12 DIAGNOSIS — M9903 Segmental and somatic dysfunction of lumbar region: Secondary | ICD-10-CM | POA: Diagnosis not present

## 2020-12-12 DIAGNOSIS — M5414 Radiculopathy, thoracic region: Secondary | ICD-10-CM | POA: Diagnosis not present

## 2020-12-12 DIAGNOSIS — M9902 Segmental and somatic dysfunction of thoracic region: Secondary | ICD-10-CM | POA: Diagnosis not present

## 2020-12-12 DIAGNOSIS — M62830 Muscle spasm of back: Secondary | ICD-10-CM | POA: Diagnosis not present

## 2021-01-08 DIAGNOSIS — M5414 Radiculopathy, thoracic region: Secondary | ICD-10-CM | POA: Diagnosis not present

## 2021-01-08 DIAGNOSIS — M9902 Segmental and somatic dysfunction of thoracic region: Secondary | ICD-10-CM | POA: Diagnosis not present

## 2021-01-08 DIAGNOSIS — M9903 Segmental and somatic dysfunction of lumbar region: Secondary | ICD-10-CM | POA: Diagnosis not present

## 2021-01-08 DIAGNOSIS — M62830 Muscle spasm of back: Secondary | ICD-10-CM | POA: Diagnosis not present

## 2021-01-10 ENCOUNTER — Other Ambulatory Visit: Payer: Self-pay | Admitting: Obstetrics and Gynecology

## 2021-01-10 DIAGNOSIS — Z1231 Encounter for screening mammogram for malignant neoplasm of breast: Secondary | ICD-10-CM

## 2021-02-05 DIAGNOSIS — M9903 Segmental and somatic dysfunction of lumbar region: Secondary | ICD-10-CM | POA: Diagnosis not present

## 2021-02-05 DIAGNOSIS — M62830 Muscle spasm of back: Secondary | ICD-10-CM | POA: Diagnosis not present

## 2021-02-05 DIAGNOSIS — M9902 Segmental and somatic dysfunction of thoracic region: Secondary | ICD-10-CM | POA: Diagnosis not present

## 2021-02-05 DIAGNOSIS — M5414 Radiculopathy, thoracic region: Secondary | ICD-10-CM | POA: Diagnosis not present

## 2021-02-27 ENCOUNTER — Ambulatory Visit
Admission: RE | Admit: 2021-02-27 | Discharge: 2021-02-27 | Disposition: A | Discharge status: Home / Self Care | Payer: BC Managed Care – PPO | Source: Ambulatory Visit

## 2021-02-27 ENCOUNTER — Other Ambulatory Visit: Payer: Self-pay

## 2021-02-27 DIAGNOSIS — Z1231 Encounter for screening mammogram for malignant neoplasm of breast: Secondary | ICD-10-CM | POA: Diagnosis not present

## 2021-03-06 DIAGNOSIS — M9902 Segmental and somatic dysfunction of thoracic region: Secondary | ICD-10-CM | POA: Diagnosis not present

## 2021-03-06 DIAGNOSIS — M62830 Muscle spasm of back: Secondary | ICD-10-CM | POA: Diagnosis not present

## 2021-03-06 DIAGNOSIS — M9903 Segmental and somatic dysfunction of lumbar region: Secondary | ICD-10-CM | POA: Diagnosis not present

## 2021-03-06 DIAGNOSIS — M25511 Pain in right shoulder: Secondary | ICD-10-CM | POA: Diagnosis not present

## 2021-04-09 DIAGNOSIS — M25511 Pain in right shoulder: Secondary | ICD-10-CM | POA: Diagnosis not present

## 2021-04-09 DIAGNOSIS — M9902 Segmental and somatic dysfunction of thoracic region: Secondary | ICD-10-CM | POA: Diagnosis not present

## 2021-04-09 DIAGNOSIS — M9903 Segmental and somatic dysfunction of lumbar region: Secondary | ICD-10-CM | POA: Diagnosis not present

## 2021-04-09 DIAGNOSIS — M62830 Muscle spasm of back: Secondary | ICD-10-CM | POA: Diagnosis not present

## 2021-04-11 ENCOUNTER — Ambulatory Visit: Payer: BC Managed Care – PPO | Admitting: Internal Medicine

## 2021-04-11 ENCOUNTER — Other Ambulatory Visit: Payer: Self-pay

## 2021-04-11 ENCOUNTER — Encounter: Payer: Self-pay | Admitting: Internal Medicine

## 2021-04-11 VITALS — BP 114/78 | HR 65 | Ht 65.0 in | Wt 172.0 lb

## 2021-04-11 DIAGNOSIS — R002 Palpitations: Secondary | ICD-10-CM | POA: Diagnosis not present

## 2021-04-11 MED ORDER — ATENOLOL 25 MG PO TABS: ORAL_TABLET | ORAL | 3 refills | Status: DC

## 2021-04-11 NOTE — Patient Instructions (Signed)
Medication Instructions:  Your physician recommends that you continue on your current medications as directed. Please refer to the Current Medication list given to you today.  *If you need a refill on your cardiac medications before your next appointment, please call your pharmacy*   Lab Work: None Ordered   If you have labs (blood work) drawn today and your tests are completely normal, you will receive your results only by: [place] (if you have MyChart) OR A paper copy in the mail If you have any lab test that is abnormal or we need to change your treatment, we will call you to review the results.   Testing/Procedures: None Ordered   Follow-Up: At [facility], you and your health needs are our priority.  As part of our continuing mission to provide you with exceptional heart care, we have created designated Provider Care Teams.  These Care Teams include your primary Cardiologist (physician) and Advanced Practice Providers (APPs -  Physician Assistants and Nurse Practitioners) who all work together to provide you with the care you need, when you need it.  We recommend signing up for the patient portal called "MyChart".  Sign up information is provided on this After Visit Summary.  MyChart is used to connect with patients for Virtual Visits (Telemedicine).  Patients are able to view lab/test results, encounter notes, upcoming appointments, etc.  Non-urgent messages can be sent to your provider as well.   To learn more about what you can do with MyChart, go to [email or web address].    Your next appointment:   2 year(s)  The format for your next appointment:   In Person  Provider:   Virl Axe, MD   Other Instructions  None

## 2021-04-11 NOTE — Progress Notes (Signed)
      Patient Care Team: Brien Few, MD as PCP - General (Obstetrics and Gynecology)   HPI  Jamie [name] is a 44 y.o. female seen with a chief complaint of  palpitations associated with PACs and PVCs with nonsustained atrial tachycardia. She has been diagnosed with "chronic Lyme disease."Previously treated with alternating doxycycline and herbal therapies with symptom improvement;she continues with [facility]" but is now seeing them just once a year  Palpitations are quiescient.  Typically related to foods i.e. MSG and/or gluten but she is good with her diet  LV function normal ..>>7/17  echo normal       Past Medical History:  Diagnosis Date   Allergy    Migraines    Palpitations 11/29/2014   Holter monitor: Max/min heart rate 52-151 with average 82. Some sinus bradycardia and some sinus tachycardia. Rare PVCs; occasional PACs with 7 runs of roughly 3 beats. No arrhythmias.    Past Surgical History:  Procedure Laterality Date   CESAREAN SECTION     x3   EXERCISE TOLERANCE TEST  12/14/2014   exercise for 9 minutes. No symptoms. No arrhythmias. No EKG changes. Low-risk   WISDOM TOOTH EXTRACTION      Current Outpatient Medications  Medication Sig Dispense Refill   atenolol (TENORMIN) 25 MG tablet TAKE 1 TABLET BY MOUTH EVERYDAY AT BEDTIME 90 tablet 3   loratadine (CLARITIN) 10 MG tablet Take 10 mg by mouth daily as needed.      naproxen sodium (ANAPROX) 220 MG tablet Take 220 mg by mouth as needed (pain).      NON FORMULARY 25 mg. PREGLENOLONE Takes 3x weekly Monday, Wednesday and Friday.     rizatriptan (MAXALT) 10 MG tablet Take 10 mg by mouth as needed for migraine. May repeat in 2 hours if needed     No current facility-administered medications for this visit.    Allergies  Allergen Reactions   Meperidine Hcl     REACTION: increased BP \T\ Pulse   Sulfonamide Derivatives     REACTION: rash      Review of Systems negative except from  HPI and PMH  Physical Exam BP 114/78 (BP Location: Left Arm, Patient Position: Sitting, Cuff Size: Normal)   Pulse 65   Ht 5' 5"  (1.651 m)   Wt 172 lb (78 kg)   SpO2 99%   BMI 28.62 kg/m  Well developed and well nourished in no acute distress HENT normal Neck supple  Clear Regular rate and rhythm, no  gallop No  murmur Abd-soft with active BS No Clubbing cyanosis  edema Skin-warm and dry A & Oriented  Grossly normal sensory and motor function  ECG sinus at 65 Intervals 15/07/39    Assessment and  Plan  Atrial and ventricular ectopy-palpitations  "chronic lyme disease"    We will refill the atenolol at 25 mg.  Given the minimal symptoms, suggest that she try to wean her self off, going 12.5 mg daily for a couple of months and then every other day for a couple of months.  We will plan to see her in 2 years or as necessary

## 2021-04-23 DIAGNOSIS — R002 Palpitations: Secondary | ICD-10-CM | POA: Diagnosis not present

## 2021-05-21 DIAGNOSIS — M62830 Muscle spasm of back: Secondary | ICD-10-CM | POA: Diagnosis not present

## 2021-05-21 DIAGNOSIS — M9903 Segmental and somatic dysfunction of lumbar region: Secondary | ICD-10-CM | POA: Diagnosis not present

## 2021-05-21 DIAGNOSIS — M25511 Pain in right shoulder: Secondary | ICD-10-CM | POA: Diagnosis not present

## 2021-05-21 DIAGNOSIS — M9902 Segmental and somatic dysfunction of thoracic region: Secondary | ICD-10-CM | POA: Diagnosis not present

## 2021-05-28 DIAGNOSIS — K9041 Non-celiac gluten sensitivity: Secondary | ICD-10-CM | POA: Diagnosis not present

## 2021-05-28 DIAGNOSIS — K219 Gastro-esophageal reflux disease without esophagitis: Secondary | ICD-10-CM | POA: Diagnosis not present

## 2021-05-28 DIAGNOSIS — D509 Iron deficiency anemia, unspecified: Secondary | ICD-10-CM | POA: Diagnosis not present

## 2021-05-28 DIAGNOSIS — Z1211 Encounter for screening for malignant neoplasm of colon: Secondary | ICD-10-CM | POA: Diagnosis not present

## 2021-06-18 DIAGNOSIS — M9903 Segmental and somatic dysfunction of lumbar region: Secondary | ICD-10-CM | POA: Diagnosis not present

## 2021-06-18 DIAGNOSIS — M62830 Muscle spasm of back: Secondary | ICD-10-CM | POA: Diagnosis not present

## 2021-06-18 DIAGNOSIS — R891 Abnormal level of hormones in specimens from other organs, systems and tissues: Secondary | ICD-10-CM | POA: Diagnosis not present

## 2021-06-18 DIAGNOSIS — M25511 Pain in right shoulder: Secondary | ICD-10-CM | POA: Diagnosis not present

## 2021-06-18 DIAGNOSIS — R270 Ataxia, unspecified: Secondary | ICD-10-CM | POA: Diagnosis not present

## 2021-06-18 DIAGNOSIS — E610 Copper deficiency: Secondary | ICD-10-CM | POA: Diagnosis not present

## 2021-06-18 DIAGNOSIS — M9902 Segmental and somatic dysfunction of thoracic region: Secondary | ICD-10-CM | POA: Diagnosis not present

## 2021-06-18 DIAGNOSIS — E270 Other adrenocortical overactivity: Secondary | ICD-10-CM | POA: Diagnosis not present

## 2021-07-01 DIAGNOSIS — R002 Palpitations: Secondary | ICD-10-CM | POA: Diagnosis not present

## 2021-07-01 DIAGNOSIS — G43909 Migraine, unspecified, not intractable, without status migrainosus: Secondary | ICD-10-CM | POA: Diagnosis not present

## 2021-07-01 DIAGNOSIS — R5382 Chronic fatigue, unspecified: Secondary | ICD-10-CM | POA: Diagnosis not present

## 2021-07-01 DIAGNOSIS — E60 Dietary zinc deficiency: Secondary | ICD-10-CM | POA: Diagnosis not present

## 2021-07-05 DIAGNOSIS — L570 Actinic keratosis: Secondary | ICD-10-CM | POA: Diagnosis not present

## 2021-07-05 DIAGNOSIS — D2261 Melanocytic nevi of right upper limb, including shoulder: Secondary | ICD-10-CM | POA: Diagnosis not present

## 2021-07-05 DIAGNOSIS — L578 Other skin changes due to chronic exposure to nonionizing radiation: Secondary | ICD-10-CM | POA: Diagnosis not present

## 2021-07-05 DIAGNOSIS — L814 Other melanin hyperpigmentation: Secondary | ICD-10-CM | POA: Diagnosis not present

## 2021-07-23 DIAGNOSIS — M25511 Pain in right shoulder: Secondary | ICD-10-CM | POA: Diagnosis not present

## 2021-07-23 DIAGNOSIS — M9902 Segmental and somatic dysfunction of thoracic region: Secondary | ICD-10-CM | POA: Diagnosis not present

## 2021-07-23 DIAGNOSIS — M9903 Segmental and somatic dysfunction of lumbar region: Secondary | ICD-10-CM | POA: Diagnosis not present

## 2021-07-23 DIAGNOSIS — M62830 Muscle spasm of back: Secondary | ICD-10-CM | POA: Diagnosis not present

## 2021-08-07 DIAGNOSIS — Z01419 Encounter for gynecological examination (general) (routine) without abnormal findings: Secondary | ICD-10-CM | POA: Diagnosis not present

## 2021-08-07 DIAGNOSIS — Z124 Encounter for screening for malignant neoplasm of cervix: Secondary | ICD-10-CM | POA: Diagnosis not present

## 2021-08-07 DIAGNOSIS — Z6828 Body mass index (BMI) 28.0-28.9, adult: Secondary | ICD-10-CM | POA: Diagnosis not present

## 2021-09-02 DIAGNOSIS — M25511 Pain in right shoulder: Secondary | ICD-10-CM | POA: Diagnosis not present

## 2021-09-02 DIAGNOSIS — M9903 Segmental and somatic dysfunction of lumbar region: Secondary | ICD-10-CM | POA: Diagnosis not present

## 2021-09-02 DIAGNOSIS — M62830 Muscle spasm of back: Secondary | ICD-10-CM | POA: Diagnosis not present

## 2021-09-02 DIAGNOSIS — M9902 Segmental and somatic dysfunction of thoracic region: Secondary | ICD-10-CM | POA: Diagnosis not present

## 2021-09-18 DIAGNOSIS — Z1212 Encounter for screening for malignant neoplasm of rectum: Secondary | ICD-10-CM | POA: Diagnosis not present

## 2021-09-18 DIAGNOSIS — Z1211 Encounter for screening for malignant neoplasm of colon: Secondary | ICD-10-CM | POA: Diagnosis not present

## 2021-09-25 LAB — COLOGUARD: COLOGUARD: NEGATIVE

## 2021-09-30 DIAGNOSIS — M546 Pain in thoracic spine: Secondary | ICD-10-CM | POA: Diagnosis not present

## 2021-10-07 DIAGNOSIS — M9902 Segmental and somatic dysfunction of thoracic region: Secondary | ICD-10-CM | POA: Diagnosis not present

## 2021-10-07 DIAGNOSIS — M25511 Pain in right shoulder: Secondary | ICD-10-CM | POA: Diagnosis not present

## 2021-10-07 DIAGNOSIS — M9903 Segmental and somatic dysfunction of lumbar region: Secondary | ICD-10-CM | POA: Diagnosis not present

## 2021-10-07 DIAGNOSIS — M62830 Muscle spasm of back: Secondary | ICD-10-CM | POA: Diagnosis not present

## 2021-10-08 DIAGNOSIS — M5414 Radiculopathy, thoracic region: Secondary | ICD-10-CM | POA: Diagnosis not present

## 2021-10-15 DIAGNOSIS — M546 Pain in thoracic spine: Secondary | ICD-10-CM | POA: Diagnosis not present

## 2021-10-16 DIAGNOSIS — M9902 Segmental and somatic dysfunction of thoracic region: Secondary | ICD-10-CM | POA: Diagnosis not present

## 2021-10-16 DIAGNOSIS — M9903 Segmental and somatic dysfunction of lumbar region: Secondary | ICD-10-CM | POA: Diagnosis not present

## 2021-10-16 DIAGNOSIS — M25511 Pain in right shoulder: Secondary | ICD-10-CM | POA: Diagnosis not present

## 2021-10-16 DIAGNOSIS — M62830 Muscle spasm of back: Secondary | ICD-10-CM | POA: Diagnosis not present

## 2021-10-23 DIAGNOSIS — M25511 Pain in right shoulder: Secondary | ICD-10-CM | POA: Diagnosis not present

## 2021-10-23 DIAGNOSIS — M9903 Segmental and somatic dysfunction of lumbar region: Secondary | ICD-10-CM | POA: Diagnosis not present

## 2021-10-23 DIAGNOSIS — M9902 Segmental and somatic dysfunction of thoracic region: Secondary | ICD-10-CM | POA: Diagnosis not present

## 2021-10-23 DIAGNOSIS — M62830 Muscle spasm of back: Secondary | ICD-10-CM | POA: Diagnosis not present

## 2021-10-29 DIAGNOSIS — M9903 Segmental and somatic dysfunction of lumbar region: Secondary | ICD-10-CM | POA: Diagnosis not present

## 2021-10-29 DIAGNOSIS — M62830 Muscle spasm of back: Secondary | ICD-10-CM | POA: Diagnosis not present

## 2021-10-29 DIAGNOSIS — M9902 Segmental and somatic dysfunction of thoracic region: Secondary | ICD-10-CM | POA: Diagnosis not present

## 2021-10-29 DIAGNOSIS — M25511 Pain in right shoulder: Secondary | ICD-10-CM | POA: Diagnosis not present

## 2021-11-13 DIAGNOSIS — M9902 Segmental and somatic dysfunction of thoracic region: Secondary | ICD-10-CM | POA: Diagnosis not present

## 2021-11-13 DIAGNOSIS — M62830 Muscle spasm of back: Secondary | ICD-10-CM | POA: Diagnosis not present

## 2021-11-13 DIAGNOSIS — M9903 Segmental and somatic dysfunction of lumbar region: Secondary | ICD-10-CM | POA: Diagnosis not present

## 2021-11-13 DIAGNOSIS — M25511 Pain in right shoulder: Secondary | ICD-10-CM | POA: Diagnosis not present

## 2021-11-26 DIAGNOSIS — M9902 Segmental and somatic dysfunction of thoracic region: Secondary | ICD-10-CM | POA: Diagnosis not present

## 2021-11-26 DIAGNOSIS — M25511 Pain in right shoulder: Secondary | ICD-10-CM | POA: Diagnosis not present

## 2021-11-26 DIAGNOSIS — M62830 Muscle spasm of back: Secondary | ICD-10-CM | POA: Diagnosis not present

## 2021-11-26 DIAGNOSIS — M9903 Segmental and somatic dysfunction of lumbar region: Secondary | ICD-10-CM | POA: Diagnosis not present

## 2021-12-18 ENCOUNTER — Ambulatory Visit
Admission: RE | Admit: 2021-12-18 | Discharge: 2021-12-18 | Disposition: A | Discharge status: Home / Self Care | Payer: BC Managed Care – PPO | Source: Ambulatory Visit

## 2021-12-18 VITALS — BP 121/77 | HR 108 | Temp 98.8°F | Resp 18

## 2021-12-18 DIAGNOSIS — R0982 Postnasal drip: Secondary | ICD-10-CM

## 2021-12-18 DIAGNOSIS — J069 Acute upper respiratory infection, unspecified: Secondary | ICD-10-CM | POA: Diagnosis not present

## 2021-12-18 MED ORDER — PROMETHAZINE-DM 6.25-15 MG/5ML PO SYRP: 5.0000 mL | ORAL_SOLUTION | Freq: Three times a day (TID) | ORAL | 0 refills | Status: DC | PRN

## 2021-12-18 NOTE — ED Provider Notes (Signed)
Renaldo Fiddler    CSN: [id number] Arrival date & time: 12/18/21  1352      History   Chief Complaint Chief Complaint  Patient presents with   Chills    Aches. New fever.  sore throat sinus congestion - Entered by patient   Generalized Body Aches    HPI Jamie [name] is a 46 y.o. female.   HPI Patient presents today for evaluation of 3 days of nasal congestion, feeling achy and low-grade fever.  Patient reports symptoms started initially 3 days ago however yesterday she felt better.  Today she went out and did some outdoor activities and after showering she experience congestion, postnasal drainage and a persistent cough while attempting to lie down.  She has been using a nasal spray which has helped temporarily along with taking some Advil.  She is currently afebrile.  She is unaware of any known sick contacts.  She did recently return from a beach vacation. She denies any worrisome symptoms of shortness of breath, wheezing or chest pain Past Medical History:  Diagnosis Date   Allergy    Migraines    Palpitations 11/29/2014   Holter monitor: Max/min heart rate 52-151 with average 82. Some sinus bradycardia and some sinus tachycardia. Rare PVCs; occasional PACs with 7 runs of roughly 3 beats. No arrhythmias.    Patient Active Problem List   Diagnosis Date Noted   Costochondritis 10/10/2016   Situational anxiety 01/31/2015   Atypical chest pain 01/29/2015   Anemia 10/23/2014   Palpitations 10/23/2014   MIGRAINE HEADACHE 07/06/2007    Past Surgical History:  Procedure Laterality Date   CESAREAN SECTION     x3   EXERCISE TOLERANCE TEST  12/14/2014   exercise for 9 minutes. No symptoms. No arrhythmias. No EKG changes. Low-risk   WISDOM TOOTH EXTRACTION      OB History   No obstetric history on file.      Home Medications    Prior to Admission medications   Medication Sig Start Date End Date Taking? Authorizing Provider  promethazine-dextromethorphan  (PROMETHAZINE-DM) 6.25-15 MG/5ML syrup Take 5 mLs by mouth 3 (three) times daily as needed for cough. 12/18/21  Yes Bing Neighbors, FNP  atenolol (TENORMIN) 25 MG tablet TAKE 1 TABLET BY MOUTH EVERYDAY AT BEDTIME 04/11/21   Duke Salvia, MD  famotidine (PEPCID) 40 MG tablet Take 40 mg by mouth 2 (two) times daily. 09/01/21   [provider]  loratadine (CLARITIN) 10 MG tablet Take 10 mg by mouth daily as needed.     [provider]  naproxen sodium (ANAPROX) 220 MG tablet Take 220 mg by mouth as needed (pain).     [provider]  NON FORMULARY 25 mg. PREGLENOLONE Takes 3x weekly Monday, Wednesday and Friday.    [provider]  rizatriptan (MAXALT) 10 MG tablet Take 10 mg by mouth as needed for migraine. May repeat in 2 hours if needed    [provider]    Family History Family History  Adopted: Yes  Problem Relation Age of Onset   Hypertension Mother    Skin cancer Mother    Hyperlipidemia Father    Arthritis Father    Skin cancer Father    Cancer Maternal Grandmother    Rectal cancer Paternal Grandmother    Skin cancer Paternal Grandmother    Heart disease Paternal Grandfather    Brain cancer Brother     Social History Social History   Tobacco Use  Smoking status: Former    Types: E-cigarettes   Smokeless tobacco: Never  Vaping Use   Vaping Use: Never used  Substance Use Topics   Alcohol use: No    Alcohol/week: 0.0 standard drinks of alcohol   Drug use: No     Allergies   Other, Meperidine hcl, and Sulfonamide derivatives   Review of Systems Review of Systems Pertinent negatives listed in HPI  Physical Exam Triage Vital Signs ED Triage Vitals  Enc Vitals Group     BP 12/18/21 1410 121/77     Pulse Rate 12/18/21 1410 (!) 108     Resp 12/18/21 1410 18     Temp 12/18/21 1410 98.8 F (37.1 C)     Temp Source 12/18/21 1410 Oral     SpO2 12/18/21 1410 98 %     Weight --      Height --      Head Circumference  --      Peak Flow --      Pain Score 12/18/21 1408 3     Pain Loc --      Pain Edu? --      Excl. in GC? --    No data found.  Updated Vital Signs BP 121/77 (BP Location: Left Arm)   Pulse (!) 108   Temp 98.8 F (37.1 C) (Oral)   Resp 18   LMP 11/08/2021 (Approximate)   SpO2 98%   Visual Acuity Right Eye Distance:   Left Eye Distance:   Bilateral Distance:    Right Eye Near:   Left Eye Near:    Bilateral Near:     Physical Exam Vitals reviewed.  Constitutional:      Appearance: Normal appearance.  HENT:     Head: Normocephalic and atraumatic.     Nose: Mucosal edema and congestion present. No rhinorrhea.     Right Turbinates: Swollen.     Left Turbinates: Swollen.     Mouth/Throat:     Lips: Pink.     Mouth: Mucous membranes are moist.     Tongue: No lesions.     Palate: No mass.     Pharynx: Oropharynx is clear. Uvula midline.     Tonsils: 0 on the right. 0 on the left.  Cardiovascular:     Rate and Rhythm: Normal rate and regular rhythm.  Pulmonary:     Effort: Pulmonary effort is normal.     Breath sounds: Normal breath sounds.  Neurological:     Mental Status: She is alert.     GCS: GCS eye subscore is 4. GCS verbal subscore is 5. GCS motor subscore is 6.      UC Treatments / Results  Labs (all labs ordered are listed, but only abnormal results are displayed) Labs Reviewed - No data to display  EKG   Radiology No results found.  Procedures Procedures (including critical care time)  Medications Ordered in UC Medications - No data to display  Initial Impression / Assessment and Plan / UC Course  I have reviewed the triage vital signs and the nursing notes.  Pertinent labs & imaging results that were available during my care of the patient were reviewed by me and considered in my medical decision making (see chart for details).    Viral illness, symptom management warranted only. Continue ibuprofen and Tylenol as needed if body aches  persist. Recommend rest and drink plenty of fluids. Recommended OTC management for symptoms.  Prescribed Promethazine DM for acute cough. Return  if symptoms worsen or do not improve within 5 days. Final Clinical Impressions(s) / UC Diagnoses   Final diagnoses:  Viral URI with cough  Post-nasal drainage     Discharge Instructions      Recommend antihistamine to help manage post nasal drainage. Continue the nasal spray. I have prescribed Promethazine DM to help with management of cough you may take up to 3 times daily. You can also purchase over-the-counter Mucinex sinus symptoms to help with your nasal symptoms.      ED Prescriptions     Medication Sig Dispense Auth. Provider   promethazine-dextromethorphan (PROMETHAZINE-DM) 6.25-15 MG/5ML syrup Take 5 mLs by mouth 3 (three) times daily as needed for cough. 180 mL Bing Neighbors, FNP      PDMP not reviewed this encounter.   Bing Neighbors, FNP 12/18/21 1446

## 2021-12-18 NOTE — Discharge Instructions (Addendum)
Recommend antihistamine to help manage post nasal drainage. Continue the nasal spray. I have prescribed Promethazine DM to help with management of cough you may take up to 3 times daily. You can also purchase over-the-counter Mucinex sinus symptoms to help with your nasal symptoms.

## 2021-12-18 NOTE — ED Triage Notes (Signed)
Pt c/o chills, bodyaches, ST and congestion x 2 days. She started to feel better yesterday and her symptoms returned today. She now reports a fever.

## 2021-12-31 DIAGNOSIS — M25511 Pain in right shoulder: Secondary | ICD-10-CM | POA: Diagnosis not present

## 2021-12-31 DIAGNOSIS — M9903 Segmental and somatic dysfunction of lumbar region: Secondary | ICD-10-CM | POA: Diagnosis not present

## 2021-12-31 DIAGNOSIS — M9902 Segmental and somatic dysfunction of thoracic region: Secondary | ICD-10-CM | POA: Diagnosis not present

## 2021-12-31 DIAGNOSIS — M62830 Muscle spasm of back: Secondary | ICD-10-CM | POA: Diagnosis not present

## 2022-01-22 DIAGNOSIS — M62830 Muscle spasm of back: Secondary | ICD-10-CM | POA: Diagnosis not present

## 2022-01-22 DIAGNOSIS — M5136 Other intervertebral disc degeneration, lumbar region: Secondary | ICD-10-CM | POA: Diagnosis not present

## 2022-01-22 DIAGNOSIS — M9902 Segmental and somatic dysfunction of thoracic region: Secondary | ICD-10-CM | POA: Diagnosis not present

## 2022-01-22 DIAGNOSIS — M9903 Segmental and somatic dysfunction of lumbar region: Secondary | ICD-10-CM | POA: Diagnosis not present

## 2022-02-24 ENCOUNTER — Telehealth: Payer: Self-pay

## 2022-02-24 ENCOUNTER — Encounter: Payer: Self-pay | Admitting: Internal Medicine

## 2022-02-24 NOTE — Telephone Encounter (Signed)
Called patient to reply to my chart message. Patient states she started having palpitations about 2 weeks ago and resumed her atenolol. She continues to have the palpitations but denies chest pain and SOB. The episodes are worse in the evenings. Advised patient that it has been almost year since she was last seen and to make an appointment to be evaluated.  Education provided on arrhythmias and when to go to the ED.  Patient verbalized understanding.

## 2022-02-25 DIAGNOSIS — M5136 Other intervertebral disc degeneration, lumbar region: Secondary | ICD-10-CM | POA: Diagnosis not present

## 2022-02-25 DIAGNOSIS — M9903 Segmental and somatic dysfunction of lumbar region: Secondary | ICD-10-CM | POA: Diagnosis not present

## 2022-02-25 DIAGNOSIS — M62830 Muscle spasm of back: Secondary | ICD-10-CM | POA: Diagnosis not present

## 2022-02-25 DIAGNOSIS — M9902 Segmental and somatic dysfunction of thoracic region: Secondary | ICD-10-CM | POA: Diagnosis not present

## 2022-02-25 NOTE — Telephone Encounter (Signed)
Pt is scheduled to see Oda Kilts, PA-C 03/19/2022.

## 2022-03-04 DIAGNOSIS — R5382 Chronic fatigue, unspecified: Secondary | ICD-10-CM | POA: Diagnosis not present

## 2022-03-04 DIAGNOSIS — E538 Deficiency of other specified B group vitamins: Secondary | ICD-10-CM | POA: Diagnosis not present

## 2022-03-04 DIAGNOSIS — R002 Palpitations: Secondary | ICD-10-CM | POA: Diagnosis not present

## 2022-03-04 DIAGNOSIS — G43909 Migraine, unspecified, not intractable, without status migrainosus: Secondary | ICD-10-CM | POA: Diagnosis not present

## 2022-03-04 DIAGNOSIS — E559 Vitamin D deficiency, unspecified: Secondary | ICD-10-CM | POA: Diagnosis not present

## 2022-03-11 DIAGNOSIS — M62830 Muscle spasm of back: Secondary | ICD-10-CM | POA: Diagnosis not present

## 2022-03-11 DIAGNOSIS — M9903 Segmental and somatic dysfunction of lumbar region: Secondary | ICD-10-CM | POA: Diagnosis not present

## 2022-03-11 DIAGNOSIS — M9902 Segmental and somatic dysfunction of thoracic region: Secondary | ICD-10-CM | POA: Diagnosis not present

## 2022-03-11 DIAGNOSIS — M5136 Other intervertebral disc degeneration, lumbar region: Secondary | ICD-10-CM | POA: Diagnosis not present

## 2022-03-17 NOTE — Progress Notes (Unsigned)
PCP:  Brien Few, MD Primary Cardiologist: None Electrophysiologist: Virl Axe, MD   Jamie [name] is a 46 y.o. female seen today for Virl Axe, MD for routine electrophysiology followup. Since last being seen in our clinic the patient reports doing OK, but has had more palpitations starting in September.  She gradually weaned herself off last fall, and hadn't taken atenolol since January.  She is now back on.  She has palpitations most days, occasional with a "feeling in her neck" and urge to cough. No syncope, SOB, or chest pain. No specific triggers.   Past Medical History:  Diagnosis Date   Allergy    Migraines    Palpitations 11/29/2014   Holter monitor: Max/min heart rate 52-151 with average 82. Some sinus bradycardia and some sinus tachycardia. Rare PVCs; occasional PACs with 7 runs of roughly 3 beats. No arrhythmias.   Past Surgical History:  Procedure Laterality Date   CESAREAN SECTION     x3   EXERCISE TOLERANCE TEST  12/14/2014   exercise for 9 minutes. No symptoms. No arrhythmias. No EKG changes. Low-risk   WISDOM TOOTH EXTRACTION      Current Outpatient Medications  Medication Sig Dispense Refill   atenolol (TENORMIN) 25 MG tablet TAKE 1 TABLET BY MOUTH EVERYDAY AT BEDTIME 90 tablet 3   loratadine (CLARITIN) 10 MG tablet Take 10 mg by mouth daily as needed.      naproxen sodium (ANAPROX) 220 MG tablet Take 220 mg by mouth as needed (pain).      NON FORMULARY 25 mg. PREGLENOLONE Takes 3x weekly Monday, Wednesday and Friday.     rizatriptan (MAXALT) 10 MG tablet Take 10 mg by mouth as needed for migraine. May repeat in 2 hours if needed     No current facility-administered medications for this visit.    Allergies  Allergen Reactions   Other     Other reaction(s): other   Meperidine Hcl     REACTION: increased BP \\T \ Pulse   Sulfonamide Derivatives     REACTION: rash    Social History   Socioeconomic History   Marital status: Married    Spouse  name: Not on file   Number of children: Not on file   Years of education: Not on file   Highest education level: Not on file  Occupational History   Occupation: Glass blower/designer   Tobacco Use   Smoking status: Former    Types: E-cigarettes   Smokeless tobacco: Never  Scientific laboratory technician Use: Never used  Substance and Sexual Activity   Alcohol use: No    Alcohol/week: 0.0 standard drinks of alcohol   Drug use: No   Sexual activity: Yes  Other Topics Concern   Not on file  Social History Narrative   Not on file   Social Determinants of Health   Financial Resource Strain: Not on file  Food Insecurity: Not on file  Transportation Needs: Not on file  Physical Activity: Not on file  Stress: Not on file  Social Connections: Not on file  Intimate Partner Violence: Not on file     Review of Systems: All other systems reviewed and are otherwise negative except as noted above.  Physical Exam: Vitals:   03/19/22 0937  BP: 128/80  Pulse: 65  SpO2: 98%  Weight: 171 lb 3.2 oz (77.7 kg)  Height: 5\' 5"  (1.651 m)    GEN- The patient is well appearing, alert and oriented x 3 today.   HEENT:  normocephalic, atraumatic; sclera clear, conjunctiva pink; hearing intact; oropharynx clear; neck supple, no JVP Lymph- no cervical lymphadenopathy Lungs- Clear to ausculation bilaterally, normal work of breathing.  No wheezes, rales, rhonchi Heart- Regular rate and rhythm, no murmurs, rubs or gallops, PMI not laterally displaced GI- soft, non-tender, non-distended, bowel sounds present, no hepatosplenomegaly Extremities- No peripheral edema. no clubbing or cyanosis; DP/PT/radial pulses 2+ bilaterally MS- no significant deformity or atrophy Skin- warm and dry, no rash or lesion Psych- euthymic mood, full affect Neuro- strength and sensation are intact  EKG is ordered. Personal review of EKG from today shows NSR at 65 bpm  Additional studies reviewed include: Previous EP office notes.    Assessment and Plan:  1. Palpitations Previously shown to be 2/2 PACs/PVCs Echo 2017 unremarkable, normal EF Continue atenolol as has had relative good results Had severe fatigue on toprol and lopressor.  Can try diltiazem or propranolol if wants to branch out. OK to call.  Labs performed last week by [facility] who she sees tomorrow, not available for review  2. Chronic Lyme disease Treated with alternating doxycycline and herbal therapies with symptom improvement  Follow up with Dr. [name] in  December. Ok to move out if she is feeling fine and desires no changes.    Shirley Friar, PA-C  03/19/22 9:50 AM

## 2022-03-19 ENCOUNTER — Encounter: Payer: Self-pay | Admitting: Student

## 2022-03-19 ENCOUNTER — Ambulatory Visit: Payer: BC Managed Care – PPO | Attending: Student | Admitting: Student

## 2022-03-19 VITALS — BP 128/80 | HR 65 | Ht 65.0 in | Wt 171.2 lb

## 2022-03-19 DIAGNOSIS — R002 Palpitations: Secondary | ICD-10-CM | POA: Diagnosis not present

## 2022-03-19 NOTE — Patient Instructions (Signed)
Medication Instructions:  Your physician recommends that you continue on your current medications as directed. Please refer to the Current Medication list given to you today.  *If you need a refill on your cardiac medications before your next appointment, please call your pharmacy*   Lab Work: None  If you have labs (blood work) drawn today and your tests are completely normal, you will receive your results only by: MyChart Message (if you have MyChart) OR A paper copy in the mail If you have any lab test that is abnormal or we need to change your treatment, we will call you to review the results.   Follow-Up: At [facility], you and your health needs are our priority.  As part of our continuing mission to provide you with exceptional heart care, we have created designated Provider Care Teams.  These Care Teams include your primary Cardiologist (physician) and Advanced Practice Providers (APPs -  Physician Assistants and Nurse Practitioners) who all work together to provide you with the care you need, when you need it.   Your next appointment:   As scheduled  Important Information About Sugar       

## 2022-04-02 DIAGNOSIS — M5136 Other intervertebral disc degeneration, lumbar region: Secondary | ICD-10-CM | POA: Diagnosis not present

## 2022-04-02 DIAGNOSIS — M9903 Segmental and somatic dysfunction of lumbar region: Secondary | ICD-10-CM | POA: Diagnosis not present

## 2022-04-02 DIAGNOSIS — M9902 Segmental and somatic dysfunction of thoracic region: Secondary | ICD-10-CM | POA: Diagnosis not present

## 2022-04-02 DIAGNOSIS — M62830 Muscle spasm of back: Secondary | ICD-10-CM | POA: Diagnosis not present

## 2022-06-05 ENCOUNTER — Ambulatory Visit: Payer: BC Managed Care – PPO | Admitting: Internal Medicine

## 2022-06-06 ENCOUNTER — Other Ambulatory Visit: Payer: Self-pay | Admitting: Internal Medicine

## 2022-06-26 IMAGING — MG MM DIGITAL SCREENING BILAT W/ TOMO AND CAD
8 series · 8 of 24 positions shown · non-contrast
Comparison: Previous exam(s).

CLINICAL DATA: Screening.

EXAM:
DIGITAL SCREENING BILATERAL MAMMOGRAM WITH TOMOSYNTHESIS AND CAD
TECHNIQUE: Bilateral screening digital craniocaudal and mediolateral oblique
mammograms were obtained. Bilateral screening digital breast
tomosynthesis was performed. The images were evaluated with
computer-aided detection.

[R CC synth-2D]
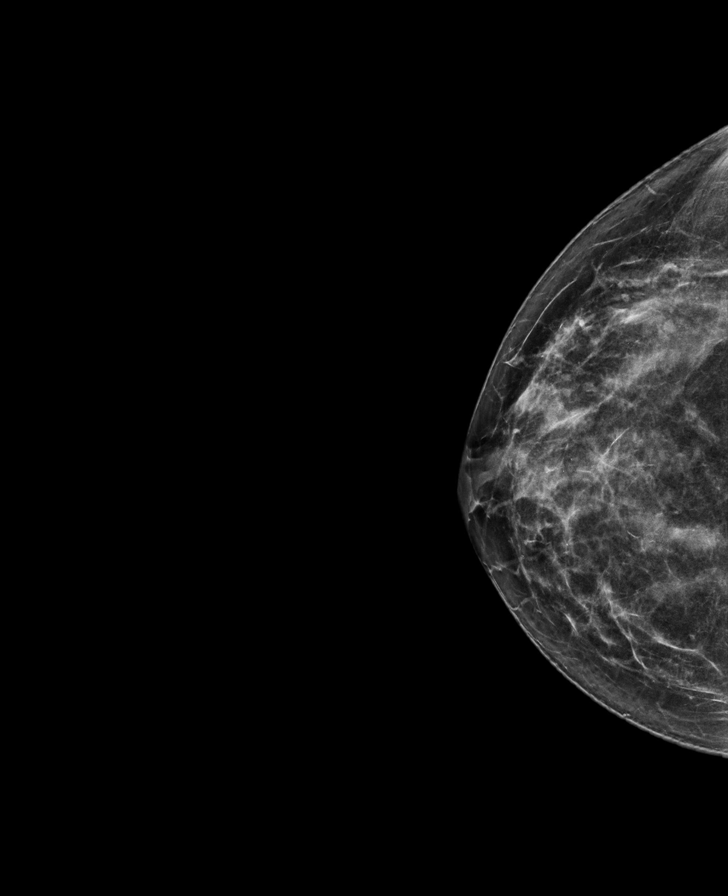

[L CC synth-2D]
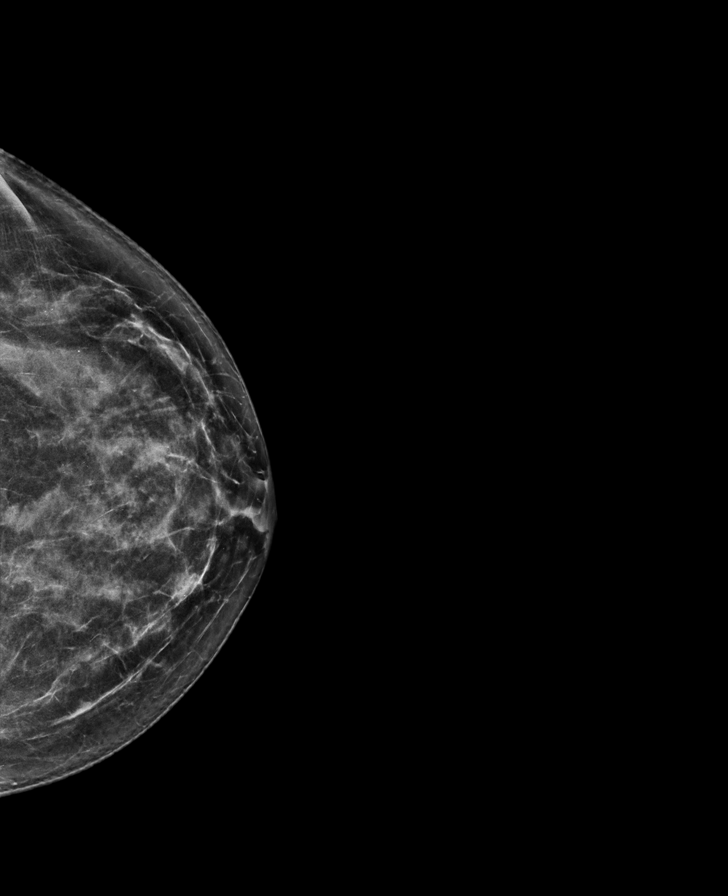

[L MLO synth-2D]
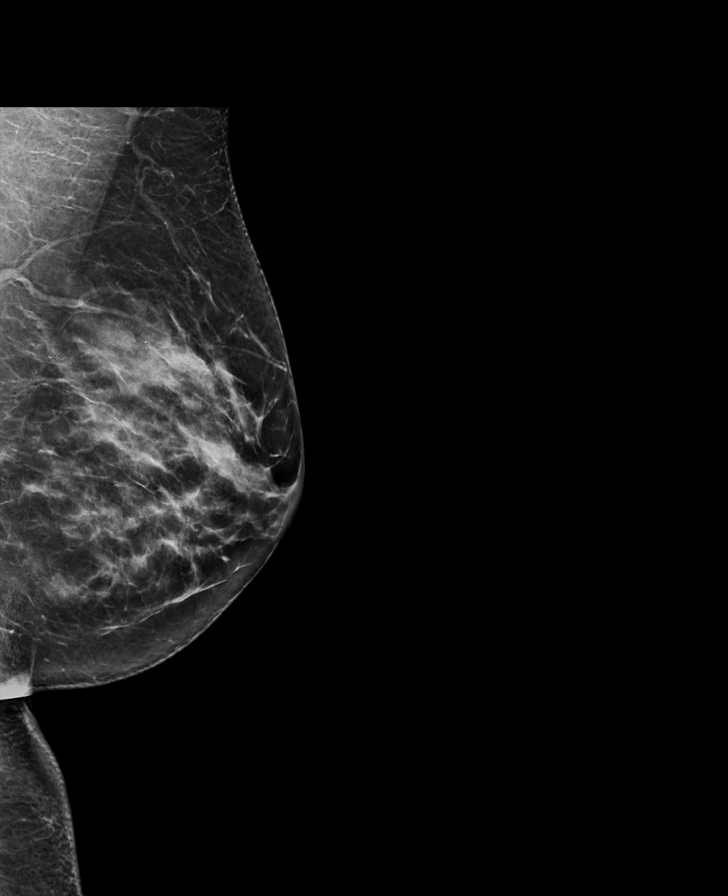

[R MLO synth-2D]
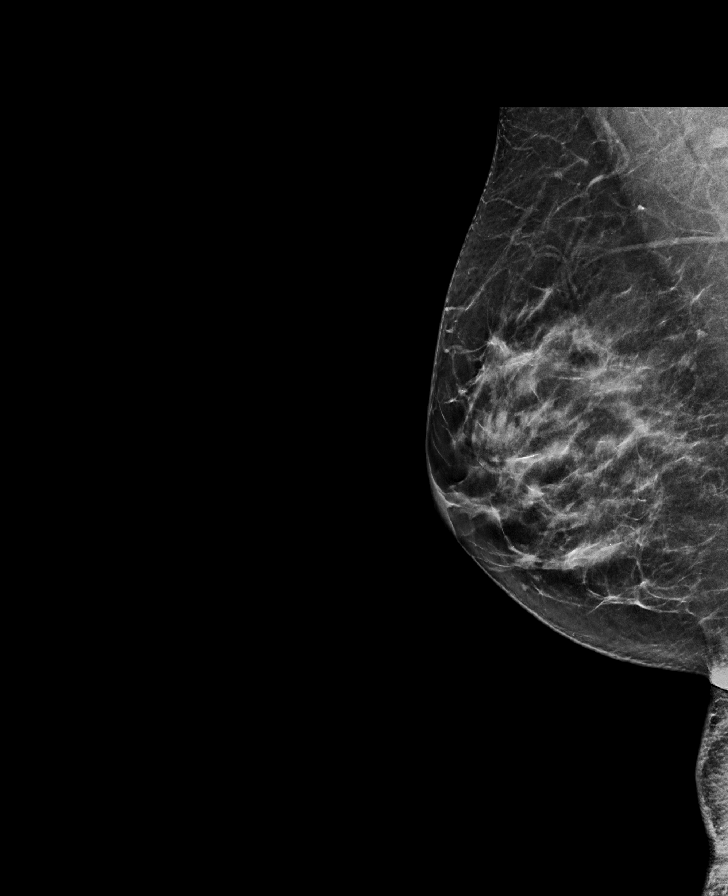

[R MLO tomo · tomo slice 35/69.0]
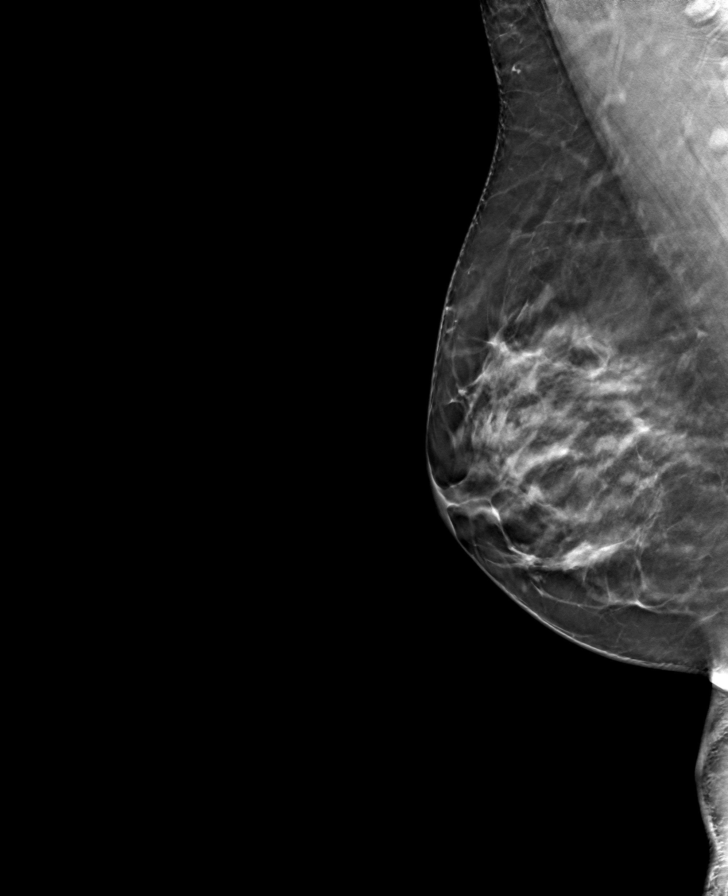

[R CC tomo · tomo slice 33/65.0]
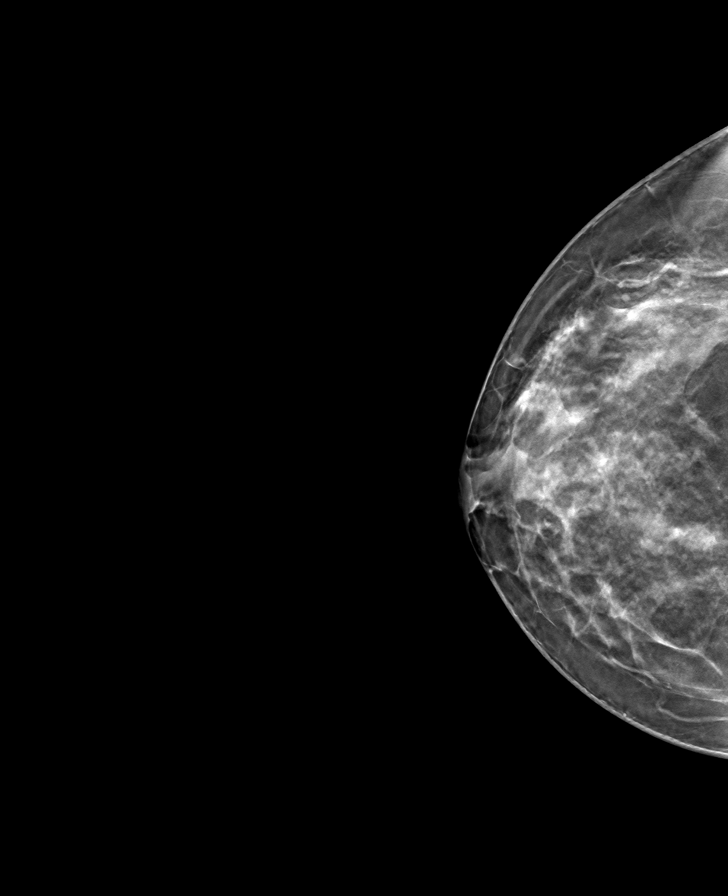

[L MLO tomo · tomo slice 37/73.0]
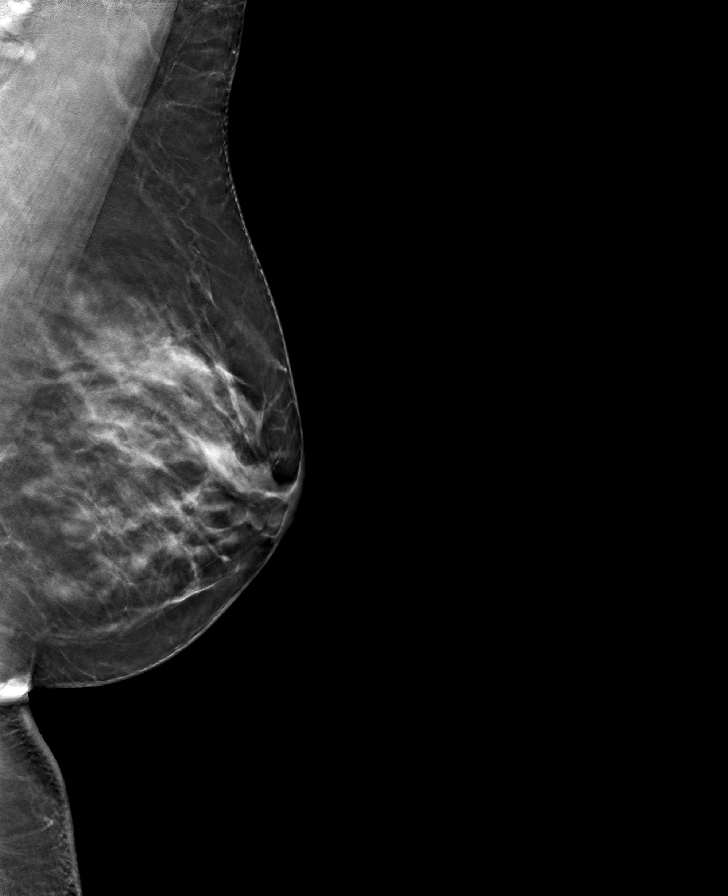

[L CC tomo · tomo slice 35/69.0]
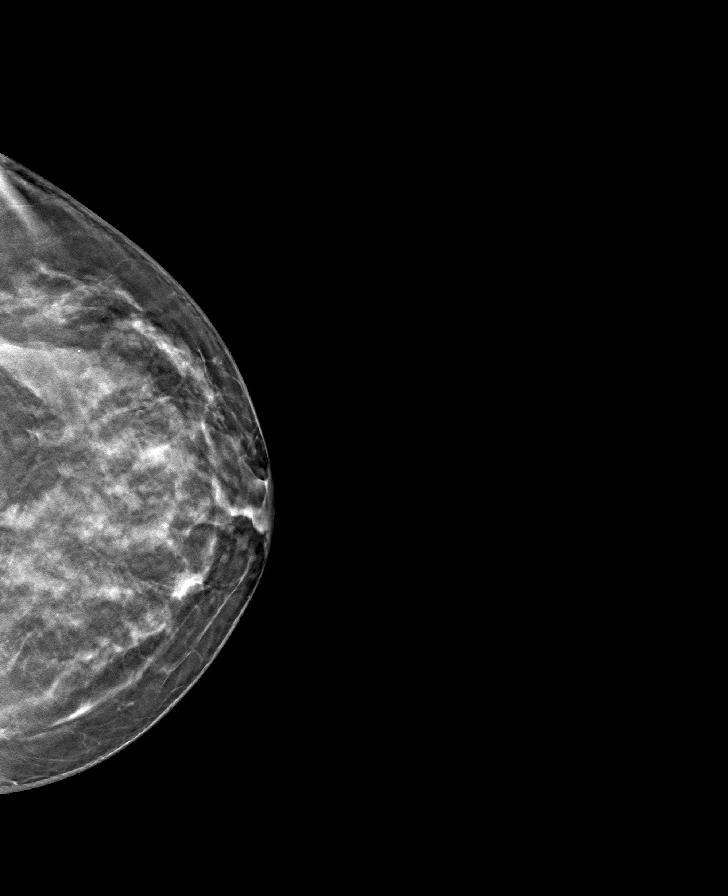

[8 of 24 positions shown; findings below may reference images not displayed]

ACR Breast Density Category c: The breast tissue is heterogeneously
dense, which may obscure small masses.
FINDINGS: There are no findings suspicious for malignancy.
IMPRESSION: No mammographic evidence of malignancy. A result letter of this
screening mammogram will be mailed directly to the patient.

RECOMMENDATION:
Screening mammogram in one year. (Code:Q3-W-BC3)

BI-RADS CATEGORY  1: Negative.

## 2022-08-06 ENCOUNTER — Other Ambulatory Visit: Payer: Self-pay | Admitting: Obstetrics and Gynecology

## 2022-08-06 DIAGNOSIS — Z1231 Encounter for screening mammogram for malignant neoplasm of breast: Secondary | ICD-10-CM

## 2022-08-12 ENCOUNTER — Ambulatory Visit
Admission: RE | Admit: 2022-08-12 | Discharge: 2022-08-12 | Disposition: A | Discharge status: Home / Self Care | Payer: BC Managed Care – PPO | Source: Ambulatory Visit | Attending: Obstetrics and Gynecology | Admitting: Obstetrics and Gynecology

## 2022-08-12 DIAGNOSIS — Z1231 Encounter for screening mammogram for malignant neoplasm of breast: Secondary | ICD-10-CM

## 2022-08-18 ENCOUNTER — Other Ambulatory Visit: Payer: Self-pay | Admitting: Obstetrics and Gynecology

## 2022-08-18 DIAGNOSIS — R928 Other abnormal and inconclusive findings on diagnostic imaging of breast: Secondary | ICD-10-CM

## 2022-09-04 ENCOUNTER — Other Ambulatory Visit: Payer: Self-pay | Admitting: Obstetrics and Gynecology

## 2022-09-04 ENCOUNTER — Ambulatory Visit
Admission: RE | Admit: 2022-09-04 | Discharge: 2022-09-04 | Disposition: A | Discharge status: Home / Self Care | Payer: BC Managed Care – PPO | Source: Ambulatory Visit | Attending: Obstetrics and Gynecology | Admitting: Obstetrics and Gynecology

## 2022-09-04 DIAGNOSIS — R928 Other abnormal and inconclusive findings on diagnostic imaging of breast: Secondary | ICD-10-CM

## 2022-12-02 ENCOUNTER — Other Ambulatory Visit: Payer: Self-pay

## 2022-12-02 ENCOUNTER — Emergency Department (HOSPITAL_BASED_OUTPATIENT_CLINIC_OR_DEPARTMENT_OTHER): Payer: BC Managed Care – PPO

## 2022-12-02 ENCOUNTER — Emergency Department (HOSPITAL_BASED_OUTPATIENT_CLINIC_OR_DEPARTMENT_OTHER)
Admission: EM | Admit: 2022-12-02 | Discharge: 2022-12-02 | Disposition: A | Discharge status: Home / Self Care | Payer: BC Managed Care – PPO | Attending: Emergency Medicine | Admitting: Emergency Medicine

## 2022-12-02 ENCOUNTER — Encounter (HOSPITAL_BASED_OUTPATIENT_CLINIC_OR_DEPARTMENT_OTHER): Payer: Self-pay

## 2022-12-02 DIAGNOSIS — R1011 Right upper quadrant pain: Secondary | ICD-10-CM | POA: Diagnosis present

## 2022-12-02 LAB — CBC
HCT: 34.2 % — ABNORMAL LOW (ref 36.0–46.0)
Hemoglobin: 11.6 g/dL — ABNORMAL LOW (ref 12.0–15.0)
MCH: 31.7 pg (ref 26.0–34.0)
MCHC: 33.9 g/dL (ref 30.0–36.0)
MCV: 93.4 fL (ref 80.0–100.0)
Platelets: 244 10*3/uL (ref 150–400)
RBC: 3.66 MIL/uL — ABNORMAL LOW (ref 3.87–5.11)
RDW: 11.9 % (ref 11.5–15.5)
WBC: 4.1 10*3/uL (ref 4.0–10.5)
nRBC: 0 % (ref 0.0–0.2)

## 2022-12-02 LAB — URINALYSIS, ROUTINE W REFLEX MICROSCOPIC
Bilirubin Urine: NEGATIVE
Glucose, UA: NEGATIVE mg/dL
Hgb urine dipstick: NEGATIVE
Ketones, ur: NEGATIVE mg/dL
Leukocytes,Ua: NEGATIVE
Nitrite: NEGATIVE
Protein, ur: NEGATIVE mg/dL
Specific Gravity, Urine: 1.005 (ref 1.005–1.030)
pH: 5.5 (ref 5.0–8.0)

## 2022-12-02 LAB — COMPREHENSIVE METABOLIC PANEL
ALT: 15 U/L (ref 0–44)
AST: 16 U/L (ref 15–41)
Albumin: 4.2 g/dL (ref 3.5–5.0)
Alkaline Phosphatase: 50 U/L (ref 38–126)
Anion gap: 9 (ref 5–15)
BUN: 8 mg/dL (ref 6–20)
CO2: 23 mmol/L (ref 22–32)
Calcium: 9 mg/dL (ref 8.9–10.3)
Chloride: 102 mmol/L (ref 98–111)
Creatinine, Ser: 0.74 mg/dL (ref 0.44–1.00)
GFR, Estimated: >60 mL/min (ref >60)
Glucose, Bld: 88 mg/dL (ref 70–99)
Potassium: 3.9 mmol/L (ref 3.5–5.1)
Sodium: 134 mmol/L — ABNORMAL LOW (ref 135–145)
Total Bilirubin: 0.3 mg/dL (ref 0.3–1.2)
Total Protein: 7.3 g/dL (ref 6.5–8.1)

## 2022-12-02 LAB — LIPASE, BLOOD: Lipase: 15 U/L (ref 11–51)

## 2022-12-02 LAB — PREGNANCY, URINE: Preg Test, Ur: NEGATIVE

## 2022-12-02 MED ORDER — ONDANSETRON 4 MG PO TBDP: 4.0000 mg | ORAL_TABLET | Freq: Three times a day (TID) | ORAL | 0 refills | Status: AC | PRN

## 2022-12-02 MED ORDER — ONDANSETRON HCL 4 MG/2ML IJ SOLN
4.0000 mg | Freq: Once | INTRAMUSCULAR | Status: AC
  Administered 2022-12-02: 4 mg via INTRAVENOUS
  Filled 2022-12-02: qty 2

## 2022-12-02 MED ORDER — HYDROCODONE-ACETAMINOPHEN 5-325 MG PO TABS: 2.0000 | ORAL_TABLET | ORAL | 0 refills | Status: AC | PRN

## 2022-12-02 MED ORDER — LACTATED RINGERS IV BOLUS
1000.0000 mL | Freq: Once | INTRAVENOUS | Status: AC
  Administered 2022-12-02: 1000 mL via INTRAVENOUS

## 2022-12-02 MED ORDER — IOHEXOL 300 MG/ML  SOLN
100.0000 mL | Freq: Once | INTRAMUSCULAR | Status: AC | PRN
  Administered 2022-12-02: 80 mL via INTRAVENOUS

## 2022-12-02 MED ORDER — MORPHINE SULFATE (PF) 4 MG/ML IV SOLN
4.0000 mg | Freq: Once | INTRAVENOUS | Status: AC
  Administered 2022-12-02: 4 mg via INTRAVENOUS
  Filled 2022-12-02: qty 1

## 2022-12-02 MED ORDER — PANTOPRAZOLE SODIUM 40 MG PO TBEC: 40.0000 mg | DELAYED_RELEASE_TABLET | Freq: Every day | ORAL | 1 refills | Status: AC

## 2022-12-02 NOTE — ED Provider Notes (Signed)
[place] EMERGENCY DEPARTMENT AT [facility] Provider Note   CSN: [id number] Arrival date & time: 12/02/22  1859     History  Chief Complaint  Patient presents with   Abdominal Pain    Jamie [name] is a 47 y.o. female.  47 year old female presents today for evaluation of right upper quadrant abdominal pain that began Saturday.  Initially was intermittent.  Today has been constant.  Endorses associated nausea but denies vomiting.  Some diarrhea.  Initially started Saturday the pain was postprandial.  Currently describes it as an aching pain.  States in the past she has been told she has sludge in the gallbladder.  Does follow with a gastroenterologist.  Recently seen them last year.  The history is provided by the patient. No language interpreter was used.       Home Medications Prior to Admission medications   Medication Sig Start Date End Date Taking? Authorizing Provider  atenolol (TENORMIN) 25 MG tablet TAKE 1 TABLET BY MOUTH EVERYDAY AT BEDTIME 06/06/22   Duke Salvia, MD  loratadine (CLARITIN) 10 MG tablet Take 10 mg by mouth daily as needed.     [provider]  naproxen sodium (ANAPROX) 220 MG tablet Take 220 mg by mouth as needed (pain).     [provider]  NON FORMULARY 25 mg. PREGLENOLONE Takes 3x weekly Monday, Wednesday and Friday.    [provider]  rizatriptan (MAXALT) 10 MG tablet Take 10 mg by mouth as needed for migraine. May repeat in 2 hours if needed    [provider]      Allergies    Other, Meperidine hcl, and Sulfonamide derivatives    Review of Systems   Review of Systems  Constitutional:  Negative for fever.  Gastrointestinal:  Positive for abdominal pain, diarrhea and nausea. Negative for constipation and vomiting.  Genitourinary:  Negative for dysuria.  Neurological:  Negative for light-headedness.  All other systems reviewed and are negative.   Physical Exam Updated Vital Signs BP 128/88  (BP Location: Right Arm)   Pulse 71   Temp 98.6 F (37 C)   Resp 14   Ht 5\' 5"  (1.651 m)   Wt 73.9 kg   SpO2 100%   BMI 27.12 kg/m  Physical Exam Vitals and nursing note reviewed.  Constitutional:      General: She is not in acute distress.    Appearance: Normal appearance. She is not ill-appearing.  HENT:     Head: Normocephalic and atraumatic.     Nose: Nose normal.  Eyes:     Conjunctiva/sclera: Conjunctivae normal.  Pulmonary:     Effort: Pulmonary effort is normal. No respiratory distress.  Abdominal:     General: There is no distension.     Palpations: Abdomen is soft.     Tenderness: There is no abdominal tenderness. There is no right CVA tenderness, left CVA tenderness or guarding.  Musculoskeletal:        General: No deformity. Normal range of motion.     Cervical back: Normal range of motion.  Skin:    Findings: No rash.  Neurological:     Mental Status: She is alert.     ED Results / Procedures / Treatments   Labs (all labs ordered are listed, but only abnormal results are displayed) Labs Reviewed  COMPREHENSIVE METABOLIC PANEL - Abnormal; Notable for the following components:      Result Value   Sodium 134 (*)    All other  components within normal limits  CBC - Abnormal; Notable for the following components:   RBC 3.66 (*)    Hemoglobin 11.6 (*)    HCT 34.2 (*)    All other components within normal limits  LIPASE, BLOOD  URINALYSIS, ROUTINE W REFLEX MICROSCOPIC  PREGNANCY, URINE    EKG EKG Interpretation  Date/Time:  Tuesday December 02 2022 19:09:42 EDT Ventricular Rate:  79 PR Interval:  134 QRS Duration: 76 QT Interval:  374 QTC Calculation: 428 R Axis:   60 Text Interpretation: Normal sinus rhythm Nonspecific ST abnormality Abnormal ECG When compared with ECG of 02-Oct-2017 17:14, No significant change was found Confirmed by Ernie Avena ([id number]) on 12/02/2022 7:46:35 PM  Radiology CT ABDOMEN PELVIS W CONTRAST  Result Date:  12/02/2022 CLINICAL DATA:  Right upper quadrant pain. EXAM: CT ABDOMEN AND PELVIS WITH CONTRAST TECHNIQUE: Multidetector CT imaging of the abdomen and pelvis was performed using the standard protocol following bolus administration of intravenous contrast. RADIATION DOSE REDUCTION: This exam was performed according to the departmental dose-optimization program which includes automated exposure control, adjustment of the mA and/or kV according to patient size and/or use of iterative reconstruction technique. CONTRAST:  80mL OMNIPAQUE IOHEXOL 300 MG/ML  SOLN COMPARISON:  June 25, 2018 FINDINGS: Lower chest: No acute abnormality. Hepatobiliary: A 16 mm diameter focus of parenchymal low attenuation is seen within the anterior aspect of the right lobe of the liver. A similar appearing 5 mm focus of low attenuation is seen within the posteromedial aspect of the right lobe. The gallbladder is normal in appearance, without evidence of gallstones, gallbladder wall thickening or pericholecystic inflammation. Pancreas: Unremarkable. No pancreatic ductal dilatation or surrounding inflammatory changes. Spleen: Normal in size without focal abnormality. Adrenals/Urinary Tract: Adrenal glands are unremarkable. Kidneys are normal, without renal calculi, focal lesion, or hydronephrosis. Bladder is unremarkable. Stomach/Bowel: Stomach is within normal limits. Appendix appears normal. No evidence of bowel wall thickening, distention, or inflammatory changes. Vascular/Lymphatic: No significant vascular findings are present. No enlarged abdominal or pelvic lymph nodes. Reproductive: Uterus and bilateral adnexa are unremarkable. Other: No abdominal wall hernia or abnormality. No abdominopelvic ascites. Musculoskeletal: No acute or significant osseous findings. IMPRESSION: 1. Small hepatic cysts. 2. No acute or active process within the abdomen or pelvis. Electronically Signed   By: Aram Candela M.D.   On: 12/02/2022 20:14     Procedures Procedures    Medications Ordered in ED Medications  lactated ringers bolus 1,000 mL (1,000 mLs Intravenous New Bag/Given 12/02/22 2024)  ondansetron (ZOFRAN) injection 4 mg (4 mg Intravenous Given 12/02/22 2022)  morphine (PF) 4 MG/ML injection 4 mg (4 mg Intravenous Given 12/02/22 2022)  iohexol (OMNIPAQUE) 300 MG/ML solution 100 mL (80 mLs Intravenous Contrast Given 12/02/22 1957)    ED Course/ Medical Decision Making/ A&P                             Medical Decision Making Amount and/or Complexity of Data Reviewed Labs: ordered. Radiology: ordered.  Risk Prescription drug management.   47 year old female presents today for evaluation of right upper quadrant abdominal pain.  Abdomen is soft and without tenderness.  Negative Murphy sign.  Blood work obtained.  No leukocytosis.  Mild anemia but around baseline.  UA without evidence of UTI.  CMP with sodium of 134 otherwise unremarkable.  LFTs in particular are within normal limits.  Lipase within normal limit.  Pregnancy test negative.  CT abdomen pelvis obtained as  we do not have ultrasound in the emergency department tonight.  Demonstrates no acute finding particularly no evidence of gallbladder wall thickening, gallstones, or dilated CBD.  However given her history, and clinical picture we will provide her referral to general surgery as it could still be her gallbladder causing her symptoms.  Discussed with patient.  She voices understanding and is in agreement with plan.  Will also start her on Protonix.  She will follow-up with her gastroenterologist.  Pain medication given for symptom management along with Zofran.   Final Clinical Impression(s) / ED Diagnoses Final diagnoses:  RUQ pain    Rx / DC Orders ED Discharge Orders          Ordered    HYDROcodone-acetaminophen (NORCO/VICODIN) 5-325 MG tablet  Every 4 hours PRN        12/02/22 2059    pantoprazole (PROTONIX) 40 MG tablet  Daily        12/02/22 2059     ondansetron (ZOFRAN-ODT) 4 MG disintegrating tablet  Every 8 hours PRN        12/02/22 2059              Marita [place], PA-C 12/02/22 2059    Ernie Avena, MD 12/02/22 2103

## 2022-12-02 NOTE — Discharge Instructions (Signed)
Your workup today was overall reassuring.  No signs of infection or other concerning cause of your abdominal pain.  I have sent in pain medication, Protonix, as well as Zofran to help with nausea.  For any concerning symptoms return to the emergency room.  I have given you referral to general surgery in case your symptoms persist.  Follow-up with the gastroenterologist as well.

## 2022-12-02 NOTE — ED Triage Notes (Signed)
Patient here POV from Home.  Endorses RUQ and Epigastric ABD Pain that began Saturday. More Constant today. Some Nausea. No emesis. Some Diarrhea over the past week. No known Fevers.   NAD Noted during Triage. A&Ox4. GCS 15. Ambulatory.

## 2023-06-28 ENCOUNTER — Other Ambulatory Visit: Payer: Self-pay | Admitting: Internal Medicine

## 2023-07-14 ENCOUNTER — Other Ambulatory Visit: Payer: Self-pay | Admitting: Internal Medicine

## 2023-10-19 ENCOUNTER — Encounter: Payer: Self-pay | Admitting: Emergency Medicine

## 2023-11-13 ENCOUNTER — Encounter: Payer: Self-pay | Admitting: Internal Medicine

## 2024-05-30 ENCOUNTER — Other Ambulatory Visit: Payer: Self-pay
